# Patient Record
Sex: Female | Born: 1935 | Race: White | Hispanic: No | Marital: Single | Smoking: Never smoker
Health system: Southern US, Community
[De-identification: ages and names within clinical notes are randomized; demographics above are authoritative.]

## PROBLEM LIST (undated history)

## (undated) DIAGNOSIS — E785 Hyperlipidemia, unspecified: Secondary | ICD-10-CM

## (undated) DIAGNOSIS — I2510 Atherosclerotic heart disease of native coronary artery without angina pectoris: Secondary | ICD-10-CM

## (undated) DIAGNOSIS — G629 Polyneuropathy, unspecified: Secondary | ICD-10-CM

## (undated) DIAGNOSIS — I10 Essential (primary) hypertension: Secondary | ICD-10-CM

## (undated) DIAGNOSIS — I509 Heart failure, unspecified: Secondary | ICD-10-CM

## (undated) DIAGNOSIS — I272 Pulmonary hypertension, unspecified: Secondary | ICD-10-CM

## (undated) HISTORY — PX: ABDOMINAL HYSTERECTOMY: SHX81

## (undated) HISTORY — DX: Hyperlipidemia, unspecified: E78.5

## (undated) HISTORY — DX: Polyneuropathy, unspecified: G62.9

## (undated) HISTORY — PX: LUMBAR DISC SURGERY: SHX700

## (undated) HISTORY — DX: Essential (primary) hypertension: I10

## (undated) HISTORY — DX: Pulmonary hypertension, unspecified: I27.20

## (undated) HISTORY — PX: MITRAL VALVE REPAIR: SHX2039

## (undated) HISTORY — PX: CORONARY ARTERY BYPASS GRAFT: SHX141

## (undated) HISTORY — DX: Atherosclerotic heart disease of native coronary artery without angina pectoris: I25.10

## (undated) HISTORY — PX: BLADDER REPAIR: SHX76

## (undated) HISTORY — DX: Heart failure, unspecified: I50.9

## (undated) HISTORY — PX: OTHER SURGICAL HISTORY: SHX169

---

## 1999-12-06 ENCOUNTER — Other Ambulatory Visit: Admission: RE | Admit: 1999-12-06 | Discharge: 1999-12-06 | Payer: Self-pay | Admitting: Gynecology

## 2002-01-22 ENCOUNTER — Other Ambulatory Visit: Admission: RE | Admit: 2002-01-22 | Discharge: 2002-01-22 | Payer: Self-pay | Admitting: Gynecology

## 2003-07-16 ENCOUNTER — Inpatient Hospital Stay (HOSPITAL_COMMUNITY): Admission: RE | Admit: 2003-07-16 | Discharge: 2003-07-17 | Payer: Self-pay | Admitting: Neurosurgery

## 2004-04-20 ENCOUNTER — Inpatient Hospital Stay (HOSPITAL_COMMUNITY): Admission: EM | Admit: 2004-04-20 | Discharge: 2004-05-05 | Payer: Self-pay | Admitting: Emergency Medicine

## 2004-04-20 ENCOUNTER — Ambulatory Visit: Payer: Self-pay | Admitting: Physical Medicine & Rehabilitation

## 2004-04-20 ENCOUNTER — Ambulatory Visit: Payer: Self-pay | Admitting: Internal Medicine

## 2004-04-21 ENCOUNTER — Encounter: Payer: Self-pay | Admitting: Cardiovascular Disease

## 2004-04-26 ENCOUNTER — Encounter: Payer: Self-pay | Admitting: Cardiovascular Disease

## 2004-05-05 ENCOUNTER — Inpatient Hospital Stay
Admission: RE | Admit: 2004-05-05 | Discharge: 2004-05-14 | Payer: Self-pay | Admitting: Physical Medicine & Rehabilitation

## 2004-06-14 ENCOUNTER — Encounter
Admission: RE | Admit: 2004-06-14 | Discharge: 2004-06-14 | Payer: Self-pay | Admitting: Thoracic Surgery (Cardiothoracic Vascular Surgery)

## 2004-12-25 ENCOUNTER — Encounter: Admission: RE | Admit: 2004-12-25 | Discharge: 2004-12-25 | Payer: Self-pay | Admitting: Neurology

## 2006-04-06 IMAGING — CR DG CHEST 1V PORT
1 series · 1 of 1 positions shown · non-contrast
Comparison: 04/22/2004

CLINICAL DATA: CHF, CABG, chest tubes

PORTABLE CHEST - 1 VIEW:

[view not recorded]
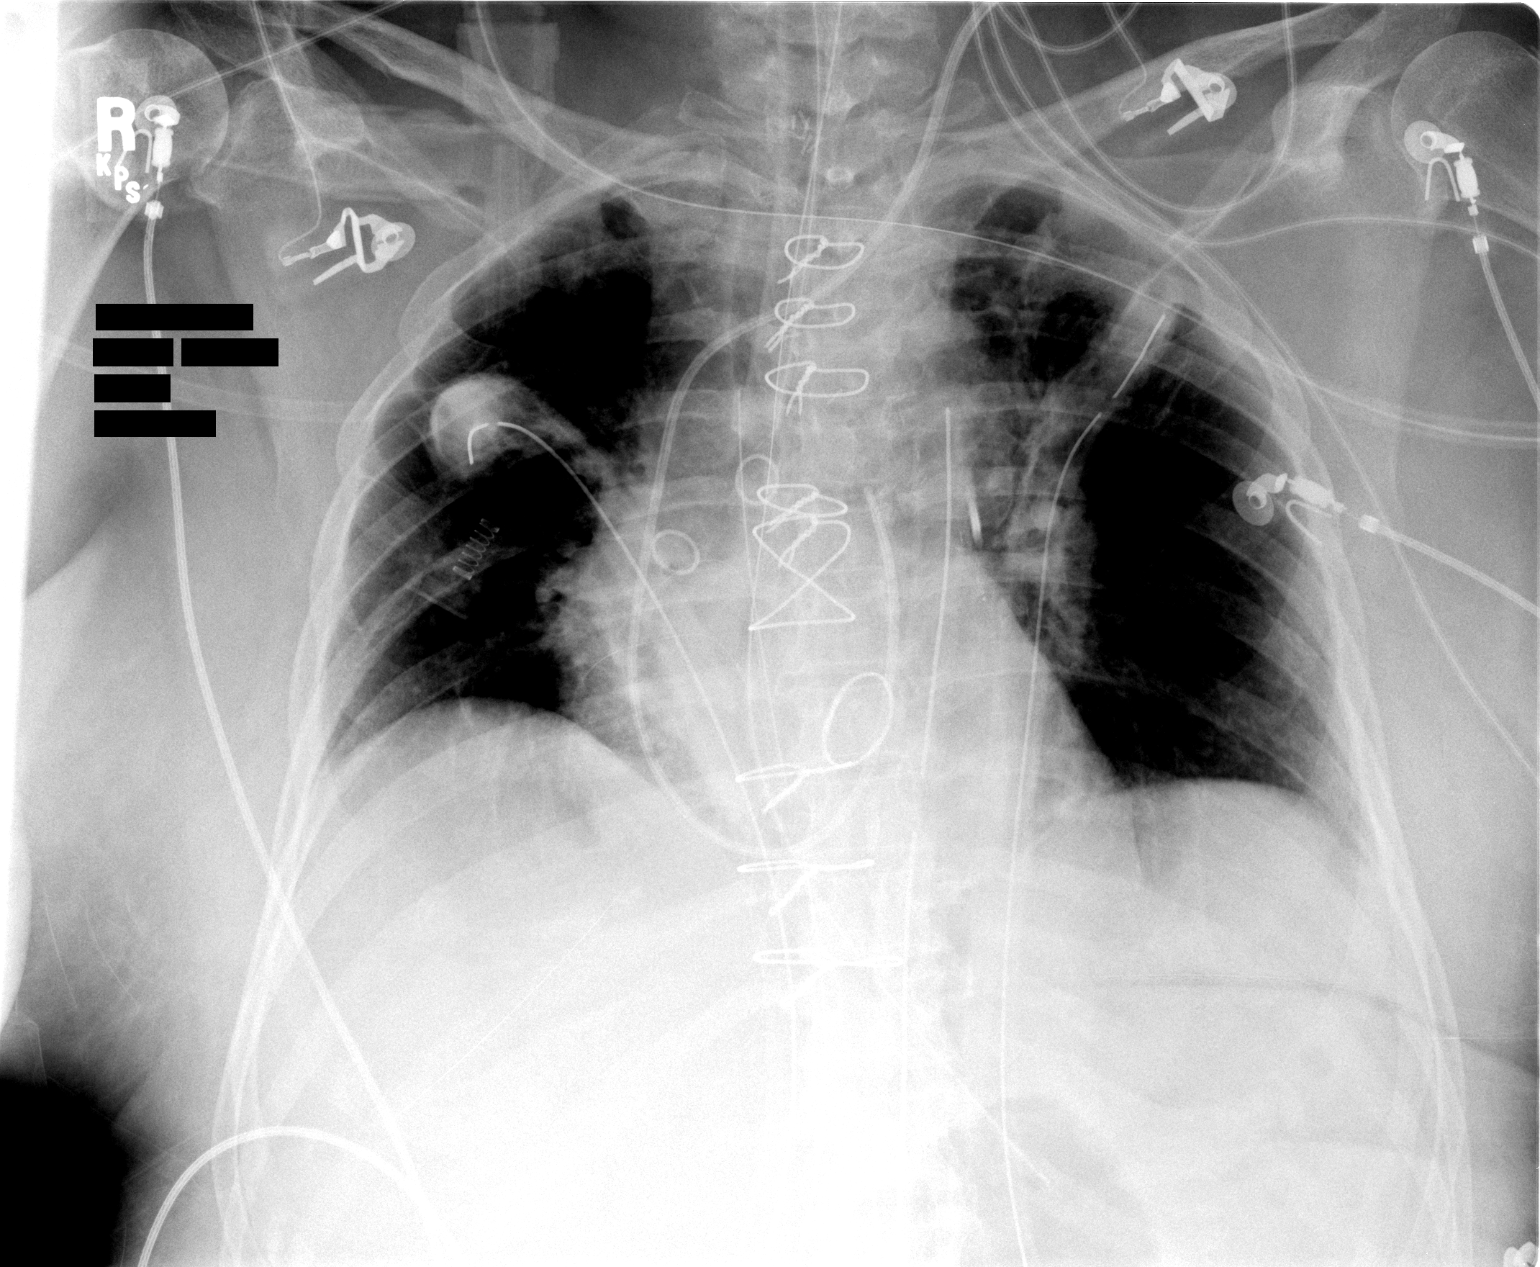

[1 of 1 positions shown; findings below may reference images not displayed]

FINDINGS: Support devices are stable including bilateral chest tubes. No
pneumothorax. IABP position stable in the proximal descending thoracic aorta.
Low lung volumes  with stable bibasilar atelectasis.
IMPRESSION: No change in the appearance of the chest.

## 2006-04-08 IMAGING — CR DG CHEST 1V PORT
1 series · 1 of 1 positions shown · non-contrast
Comparison: 04/24/04.

CLINICAL DATA: Follow-up CABG procedure.  
 PORTABLE CHEST:

[view not recorded]
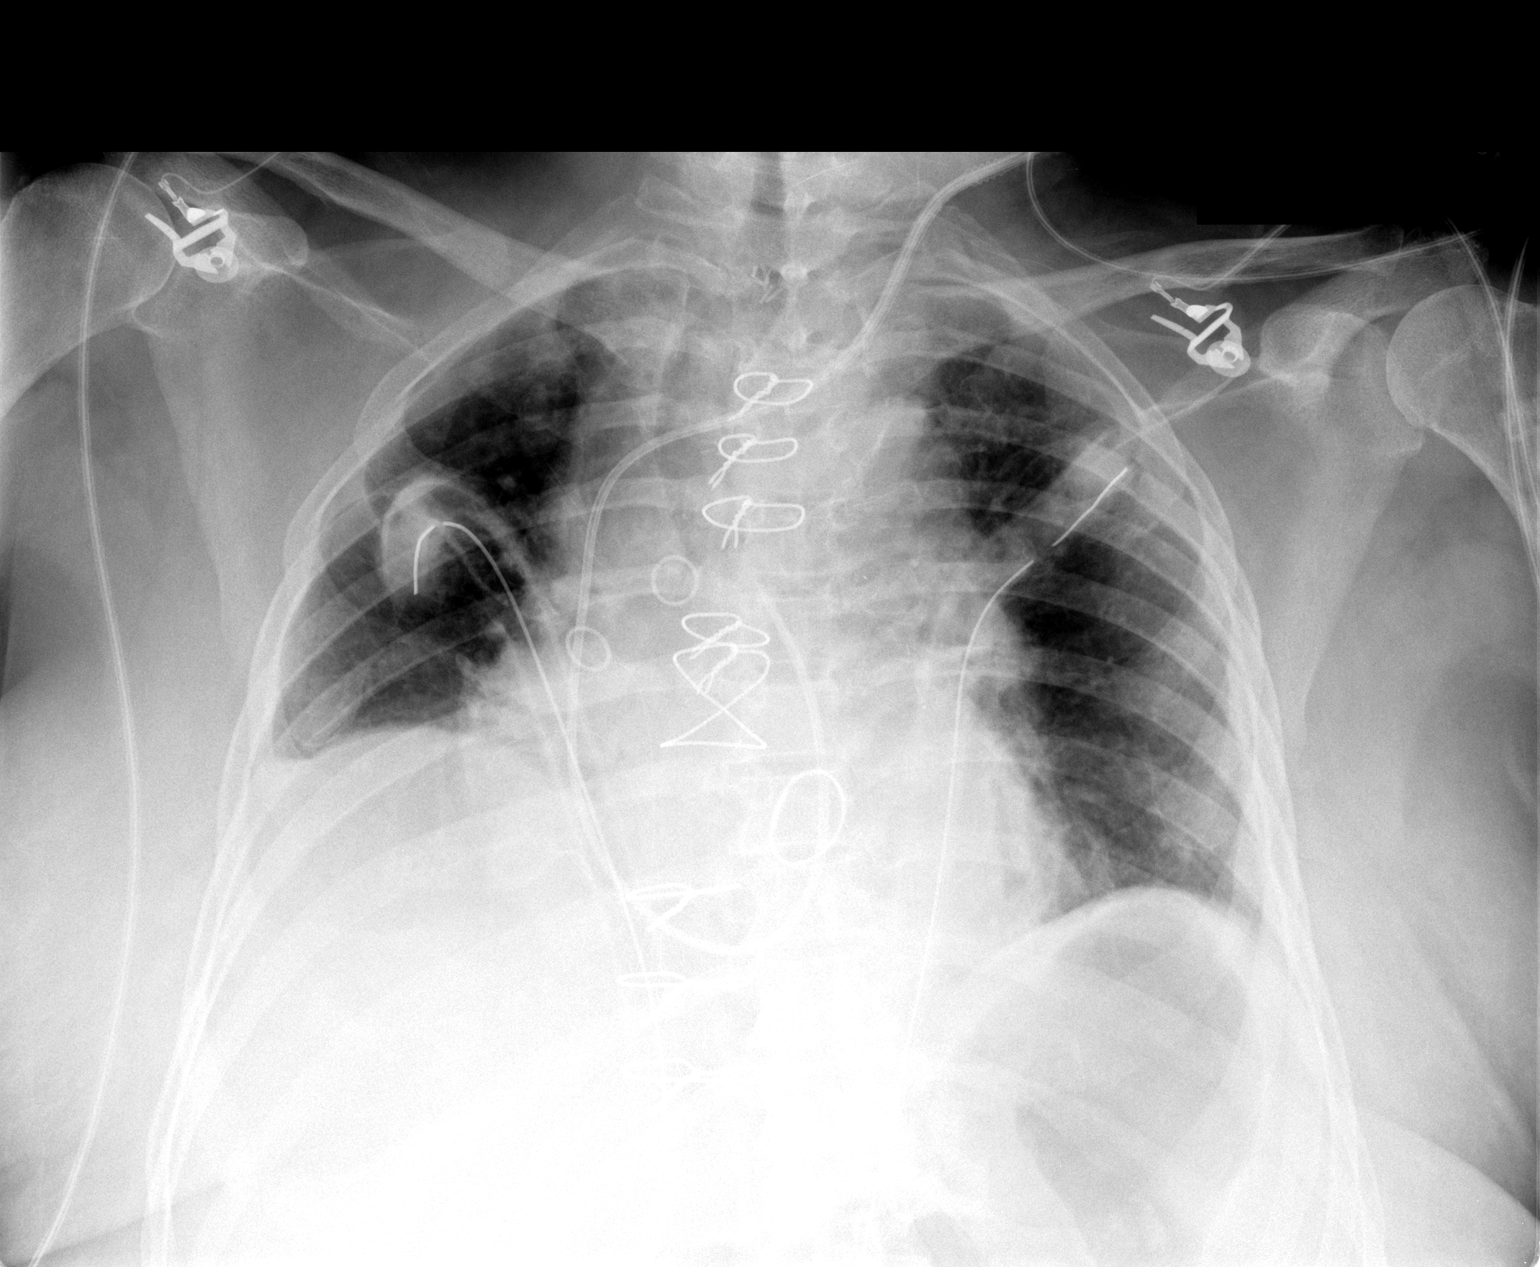

[1 of 1 positions shown; findings below may reference images not displayed]

Mediastinal drains have been removed.  Bilateral chest tubes and Swan Ganz catheter remain in satisfactory position.  Negative for pneumothorax.  Mediastinal fullness without change.  Negative for edema.
IMPRESSION: No significant change status post mediastinal drains removal.

## 2006-04-09 IMAGING — CR DG CHEST 1V PORT
1 series · 1 of 1 positions shown · non-contrast
Comparison: 04/25/04.

CLINICAL DATA: CHF.
 PORTABLE CHEST ([DATE] HOURS):

[view not recorded]
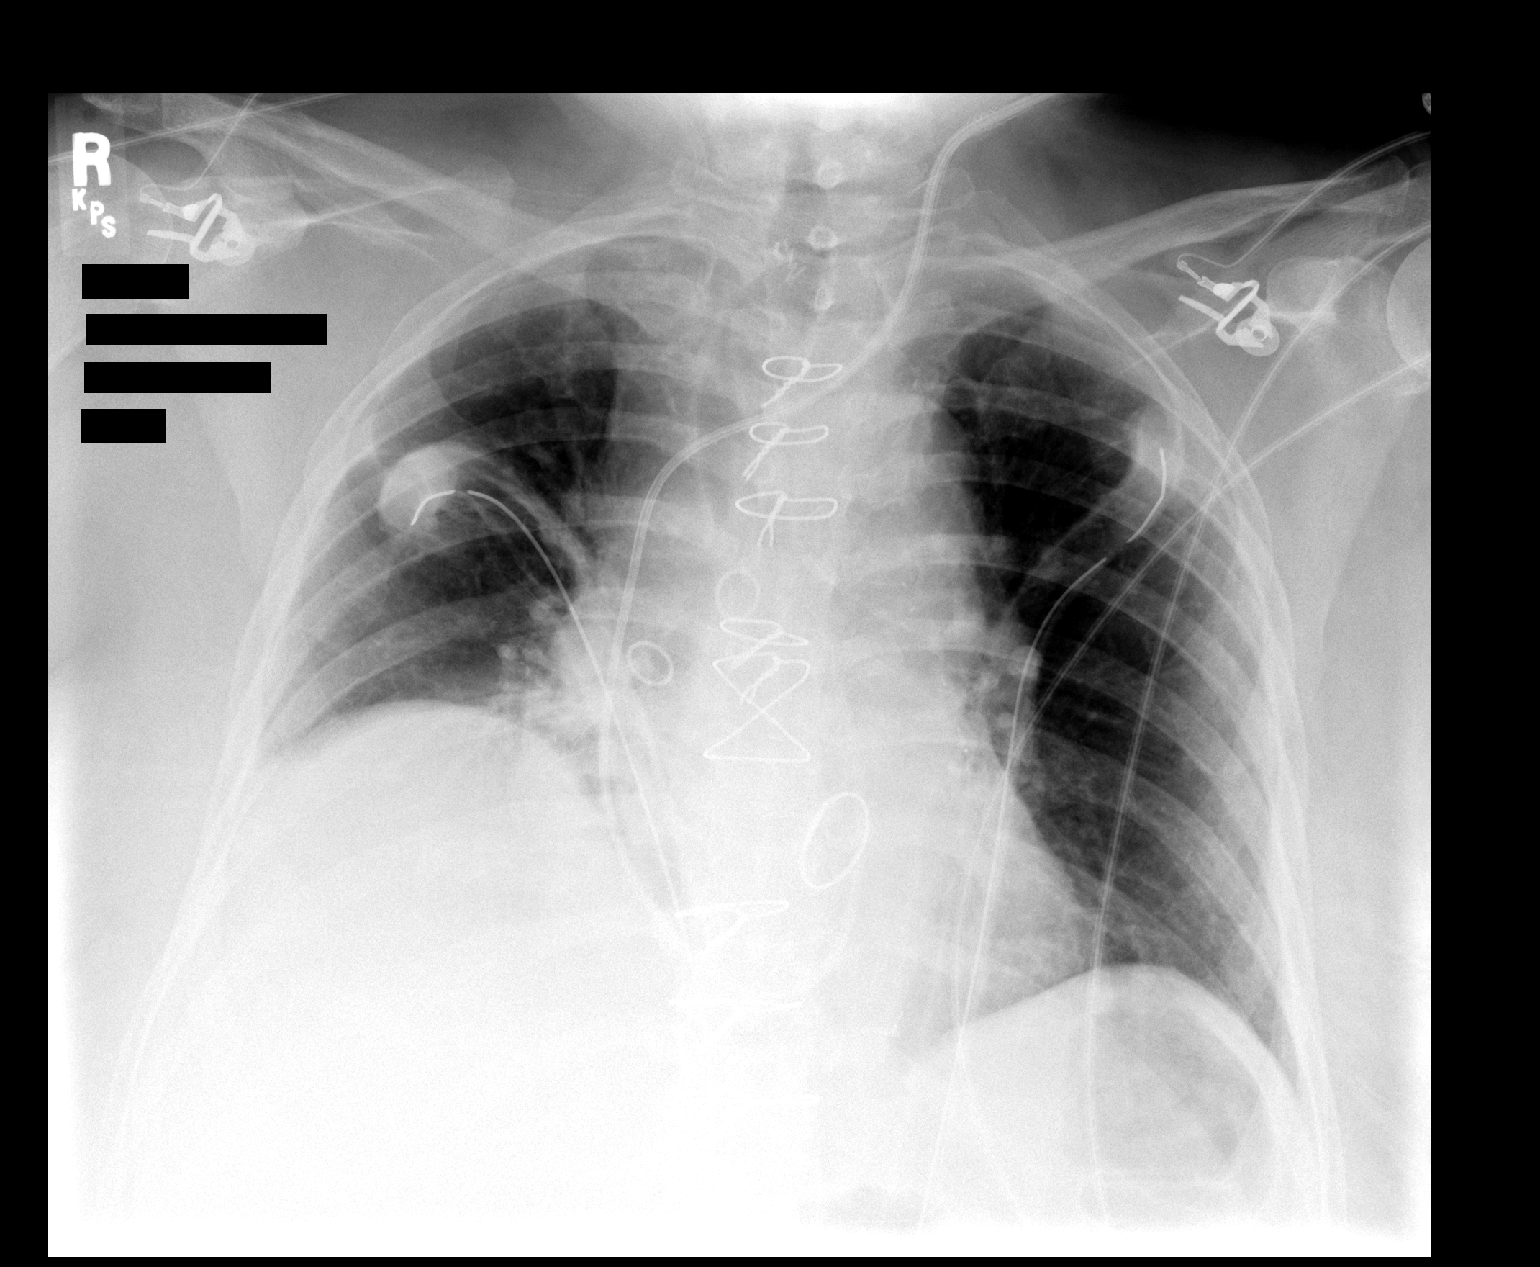

[1 of 1 positions shown; findings below may reference images not displayed]

FINDINGS: The left IJ vein introducer and Swan-Ganz catheter as well as two chest tubes are stable.  No pneumothoraces are seen.  The pulmonary vasculature is within normal limits.  Volume loss at the right lung base is stable.
IMPRESSION: No significant change.

## 2006-04-09 IMAGING — CR DG CHEST 1V PORT
1 series · 1 of 1 positions shown · non-contrast
Comparison: 04/26/04 at [DATE] a.m.

CLINICAL DATA: 81-year-old, with CHF and PICC line placement.  
 PORTABLE 1-VIEW CHEST:

[view not recorded]
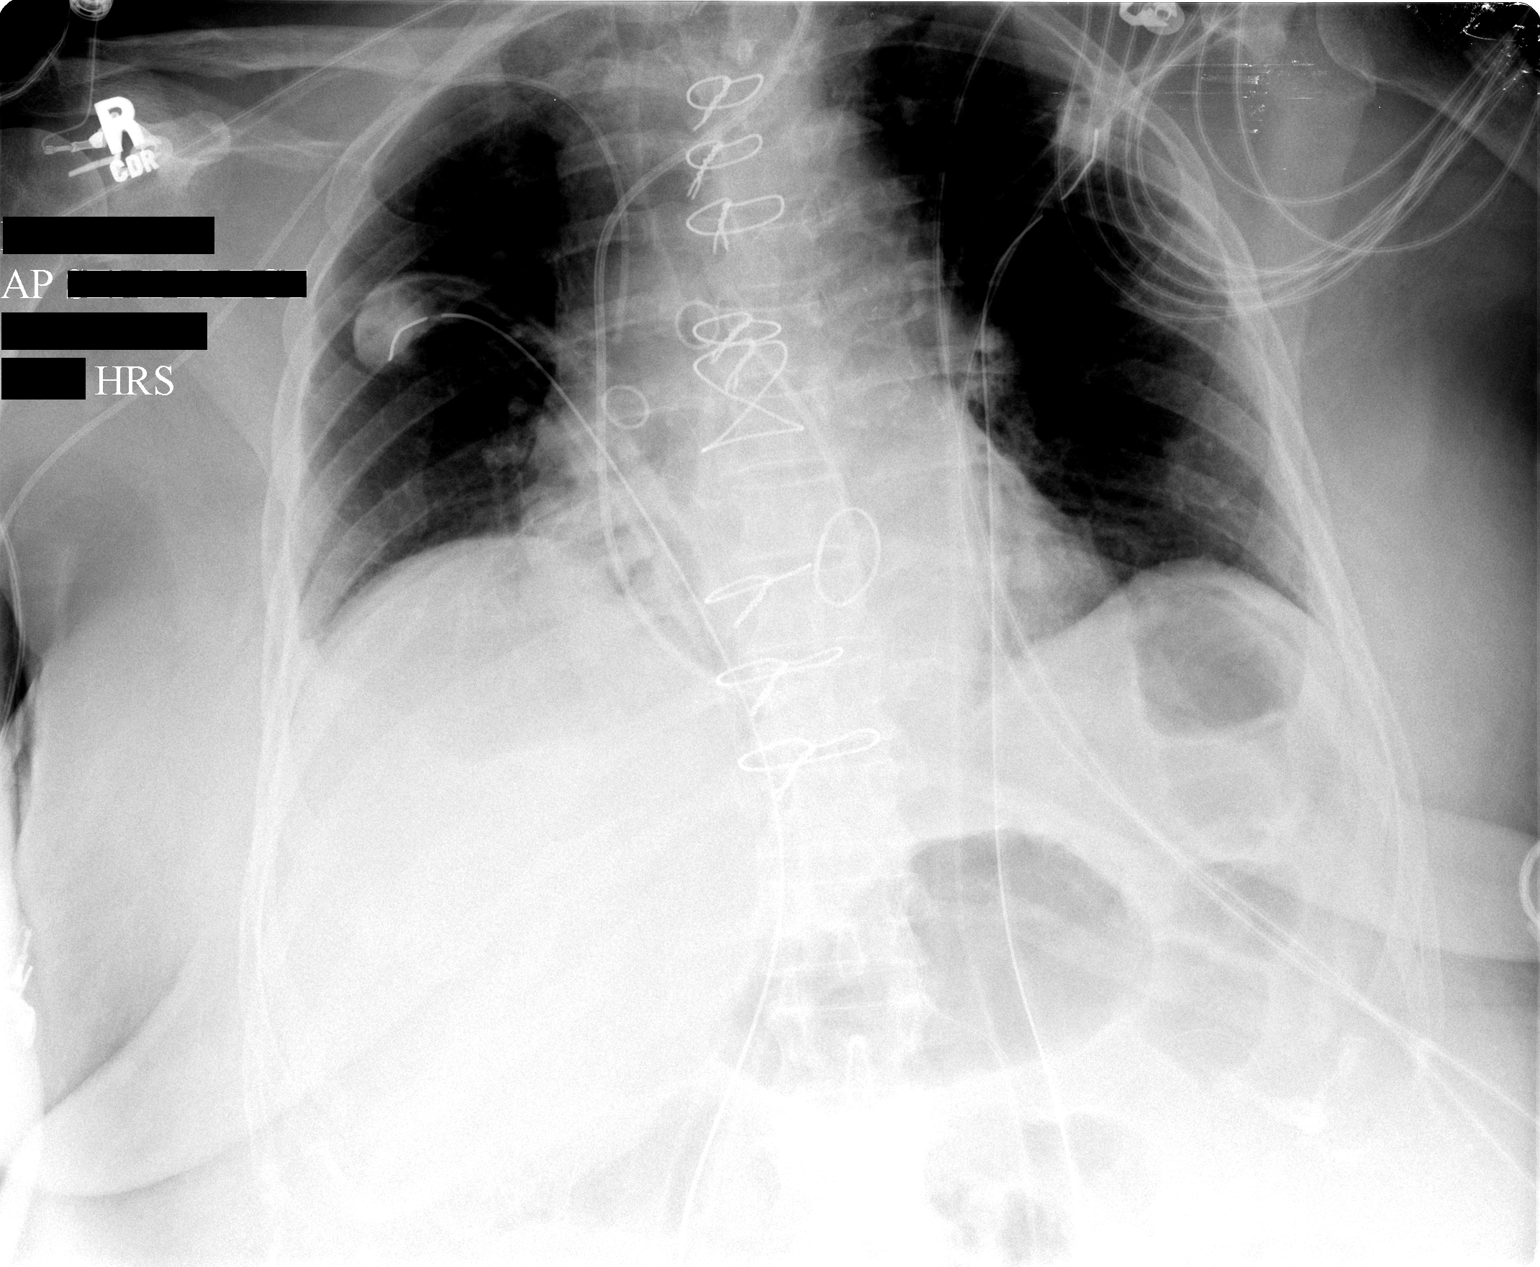

[1 of 1 positions shown; findings below may reference images not displayed]

Portable exam is performed at [DATE] hours, showing new right-sided PICC line with tip to the level of the superior vena cava.  Swan-Ganz catheter, bilateral chest tubes are unchanged in position.  There is no evidence for pneumothorax.  Heart size is enlarged.  There is elevation of the right hemidiaphragm, stable.
IMPRESSION: Right-sided PICC line as described.  No evidence for postprocedure pneumothorax.

## 2006-04-10 IMAGING — CR DG CHEST 1V PORT
1 series · 1 of 1 positions shown · non-contrast
Comparison: 04/26/04.

CLINICAL DATA: CHF.
 PORTABLE CHEST, 04/27/04, [DATE] HOURS:

[view not recorded]
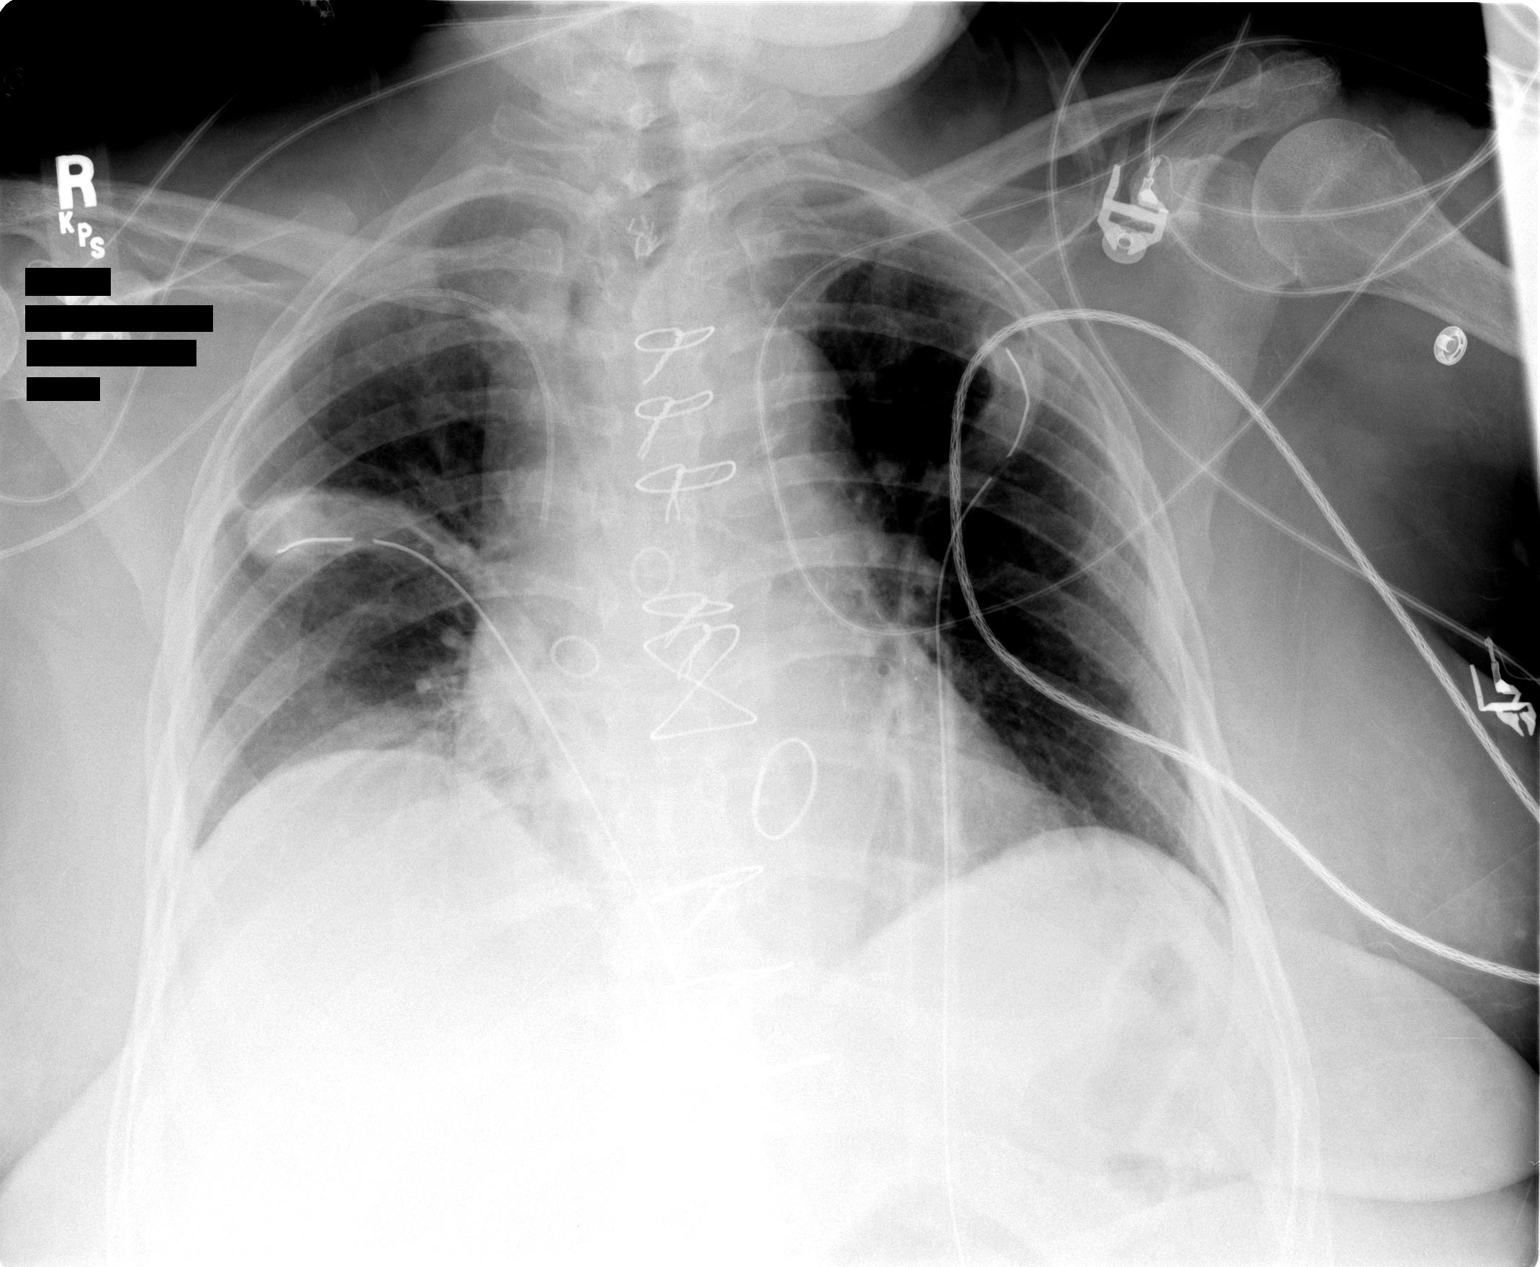

[1 of 1 positions shown; findings below may reference images not displayed]

The Swan Ganz catheter and introducer have been removed.  The PICC and bilateral chest tubes are stable without pneumothorax.  Right basilar atelectasis is stable.
IMPRESSION: Removal of the Swan Ganz catheter.  No pneumothorax.  Otherwise, no significant change.

## 2009-04-13 ENCOUNTER — Ambulatory Visit (HOSPITAL_COMMUNITY): Admission: RE | Admit: 2009-04-13 | Discharge: 2009-04-13 | Payer: Self-pay | Admitting: Family Medicine

## 2009-10-05 ENCOUNTER — Ambulatory Visit: Payer: Self-pay | Admitting: Cardiovascular Disease

## 2010-04-19 ENCOUNTER — Other Ambulatory Visit: Payer: Self-pay | Admitting: *Deleted

## 2010-04-19 ENCOUNTER — Encounter: Payer: Self-pay | Admitting: *Deleted

## 2010-04-19 DIAGNOSIS — Z79899 Other long term (current) drug therapy: Secondary | ICD-10-CM

## 2010-04-20 ENCOUNTER — Encounter: Payer: Self-pay | Admitting: Cardiovascular Disease

## 2010-04-20 ENCOUNTER — Ambulatory Visit (INDEPENDENT_AMBULATORY_CARE_PROVIDER_SITE_OTHER): Payer: Medicare Other | Admitting: Cardiovascular Disease

## 2010-04-20 ENCOUNTER — Other Ambulatory Visit (INDEPENDENT_AMBULATORY_CARE_PROVIDER_SITE_OTHER): Payer: Medicare Other | Admitting: *Deleted

## 2010-04-20 VITALS — BP 142/68 | HR 58 | Ht 66.0 in | Wt 148.0 lb

## 2010-04-20 DIAGNOSIS — I2510 Atherosclerotic heart disease of native coronary artery without angina pectoris: Secondary | ICD-10-CM

## 2010-04-20 DIAGNOSIS — M549 Dorsalgia, unspecified: Secondary | ICD-10-CM

## 2010-04-20 DIAGNOSIS — Z79899 Other long term (current) drug therapy: Secondary | ICD-10-CM

## 2010-04-20 DIAGNOSIS — I2789 Other specified pulmonary heart diseases: Secondary | ICD-10-CM

## 2010-04-20 DIAGNOSIS — I272 Pulmonary hypertension, unspecified: Secondary | ICD-10-CM | POA: Insufficient documentation

## 2010-04-20 LAB — BASIC METABOLIC PANEL
CO2: 28 mEq/L (ref 19–32)
Calcium: 9.6 mg/dL (ref 8.4–10.5)
Creatinine, Ser: 0.8 mg/dL (ref 0.4–1.2)
GFR: 70.27 mL/min (ref >60.00)
Potassium: 4 mEq/L (ref 3.5–5.1)

## 2010-04-20 NOTE — Progress Notes (Signed)
April Bennett Date of Birth  1922/11/29 [facility] / [facility] [address]     [address] [place], [place]  [address] [phone]  Fax  [phone]  History of Present Illness:  [name] is an elderly female with a history of coronary artery disease and coronary artery bypass grafting. She also has a history of mitral valve repair. She has a history of pulmonary hypertension.  She has significant peripheral neuropathy and has difficulty walking. She walks with the assistance of a rolling walker.  Current Outpatient Prescriptions on File Prior to Visit  Medication Sig Dispense Refill  . allopurinol (ZYLOPRIM) 100 MG tablet Take 100 mg by mouth daily.        Marland Kitchen aspirin 81 MG tablet Take 81 mg by mouth daily.        . furosemide (LASIX) 40 MG tablet Take 40 mg by mouth daily.        . insulin NPH (HUMULIN N,NOVOLIN N) 100 UNIT/ML injection Inject 20 Units into the skin daily.        Marland Kitchen losartan (COZAAR) 100 MG tablet Take 100 mg by mouth daily.        Marland Kitchen lovastatin (ALTOPREV) 40 MG 24 hr tablet Take 40 mg by mouth at bedtime.        . metoprolol (LOPRESSOR) 50 MG tablet Take 25 mg by mouth daily.        . Multiple Vitamin (MULTIVITAMIN) tablet Take 1 tablet by mouth daily.        Marland Kitchen omeprazole (PRILOSEC) 20 MG capsule Take 20 mg by mouth 2 (two) times daily.        . potassium chloride (K-DUR,KLOR-CON) 10 MEQ tablet Take 10 mEq by mouth 2 (two) times daily.        Marland Kitchen spironolactone (ALDACTONE) 25 MG tablet Take 25 mg by mouth daily.          Allergies  Allergen Reactions  . Amiodarone     Past Medical History  Diagnosis Date  . Pulmonary HTN   . Diabetes mellitus   . Hyperlipidemia   . Peripheral neuropathy   . Hypertension   . CHF (congestive heart failure)   . Coronary artery disease     MI; S/P MI April 2006    Past Surgical History  Procedure Date  . Coronary artery bypass graft   . Mitral valve repair   . Abdominal hysterectomy    . Bladder repair   . Lumbar disc surgery     2005  . Gastritis   . Esophagitis   . Gout     History  Smoking status  . Never Smoker   Smokeless tobacco  . Not on file    History  Alcohol Use No    No family history on file.  Reviw of Systems:  Reviewed in the HPI.  All other systems are negative.  Physical Exam: BP 142/68  Pulse 58  Ht 5\' 6"  (1.676 m)  Wt 148 lb (67.132 kg)  BMI 23.89 kg/m2 The patient is alert and oriented x 3.  The mood and affect are normal.  The skin is warm and dry.  Color is normal.  The HEENT exam reveals that the sclera are nonicteric.  The mucous membranes are moist.  The carotids are 2+ without bruits.  There is no thyromegaly.  There is no JVD.  The lungs have decreased breath sounds in the left base.  The chest wall is non tender.  The heart exam reveals a regular rate with a normal S1 and S2.  There are no murmurs, gallops, or rubs.  The PMI is not displaced.   Abdominal exam reveals good bowel sounds.  There is no guarding or rebound.  There is no hepatosplenomegaly or tenderness.  There are no masses.  Exam of the legs reveal no clubbing, cyanosis, or edema.  The legs are without rashes.  The distal pulses are intact.  Cranial nerves II - XII are intact.  Motor and sensory functions are intact.  The gait is normal.  Assessment / Plan:

## 2010-04-20 NOTE — Assessment & Plan Note (Signed)
April Bennett is very stable from a cardiac standpoint. Her blood pressure and heart rate are normal. I've encouraged her to exercise as much is possible that she has considerable problems walking because of her peripheral neuropathy.

## 2010-04-21 ENCOUNTER — Telehealth: Payer: Self-pay | Admitting: *Deleted

## 2010-04-21 NOTE — Telephone Encounter (Signed)
Patient called with lab results. Pt very HOH,April Bennett/ranger

## 2010-05-21 NOTE — Cardiovascular Report (Signed)
NAMELAELA, DEVINEY NO.:  [id number]   MEDICAL RECORD NO.:  [id number]          PATIENT TYPE:  INP   LOCATION:  2107                         FACILITY:  [facility]   PHYSICIAN:  Vesta Mixer, M.D. DATE OF BIRTH:  1922-12-11   DATE OF PROCEDURE:  04/21/2004  DATE OF DISCHARGE:                              CARDIAC CATHETERIZATION   Ms. Yeske is a 77 year old female with a 79-month history of spells.  She had a Cardiolite scan by Dr. Deborah Chalk which revealed no evidence of  ischemia and revealed normal left ventricular systolic function. She  continued to have some intermittent spells. She was admitted to the hospital  yesterday with worsening dyspnea. This morning she had flash pulmonary edema  and we are asked to consult. At the time that we evaluated her, she was  improving somewhat from her flash pulmonary edema. She still was somewhat  hyperdynamic and had some wheezes. She was transferred to the intensive care  unit and continued to get a little bit better on IV Lasix.   Later this afternoon she developed severe flash pulmonary edema with  hypotension. She developed ST-segment depression in the lateral leads with  loss of R wave in the anteroseptal leads. We brought her down to the cath  lab for urgent heart catheterization.   PROCEDURE:  Left heart catheterization with placement of an intra-aortic  balloon pump and coronary angiography.   DESCRIPTION OF PROCEDURE:  The right femoral artery cannula and the left  femoral artery were cannulated using the assistance of a Smart needle.   Initially the left angiograms were performed and then an aortic balloon pump  was placed. We used a 34-cm balloon pump.   HEMODYNAMIC RESULTS:  Her initial pressure was 50/40. With augmentation, her  systolic pressures were in the 103 range.   ANGIOGRAPHY:  1.  Left main. There was an ostial 70-80% stenosis. Following this there was      an ulcerated plaque with a 95%  stenosis in the distal left main. There      was sluggish flow down the LAD and circumflex artery.  2.  The diagonal vessels had diffuse 50% irregularities.  3.  The left circumflex artery was a moderate size vessel. There was the      first obtuse marginal artery had minor luminal irregularities. The      second obtuse marginal artery had mild luminal irregularities. The      distal circumflex artery was unremarkable.  4.  The right coronary artery had an ostial 60-70% stenosis. There was      dampening of the pressure wave form with engagement of the right      coronary artery using the 6-French catheter.   LEFT VENTRICULOGRAM:  The left ventriculogram was performed by hand  injection. It revealed an ejection fraction of 45-50%. There was severe 3+  mitral regurgitation with moderate-to-marked left atrial enlargement.   COMPLICATIONS:  None.   IMPRESSION AND PLAN:  1.  Coronary artery disease. The patient has severe left main coronary      artery disease with right coronary artery involvement. She  also has      severe mitral regurgitation.  2.  Cardiogenic shock.  3.  We have CVTS for emergent surgery including coronary artery bypass      grafting with mitral valve replacement or mitral valve repair. I have      discussed these findings with the family. They know that her prognosis,      at this point, is still fairly guarded.      PJN/MEDQ  D:  04/21/2004  T:  04/21/2004  Job:  [id number]   cc:   Madaline Guthrie, M.D.  [address]  [place] [address]  Fax: [phone]   Salvatore Decent. Cornelius Moras, M.D.  [address]  [place]  [place] [address]

## 2010-05-21 NOTE — Op Note (Signed)
[name], April Bennett            ACCOUNT NO.:  [id number]   MEDICAL RECORD NO.:  [id number]          PATIENT TYPE:  INP   LOCATION:  2307                         FACILITY:  [facility]   PHYSICIAN:  Guadalupe Maple, M.D.  DATE OF BIRTH:  25-Sep-1922   DATE OF PROCEDURE:  04/21/2004  DATE OF DISCHARGE:                                 OPERATIVE REPORT   PROCEDURE:  Intraoperative transesophageal echocardiography.   [name] is a 77 year old white female who was undergoing  coronary artery bypass grafting emergently by Dr. Tressie Stalker.  She had  earlier had a transesophageal echocardiography and this is a followup study  following her separation from cardiopulmonary bypass.  Ms. Coudriet  appears to have suffered a severe anterior and lateral wall myocardial  infarction resulting from an occluded left main coronary artery.  She  underwent emergency coronary artery bypass grafting and mitral valve  annuloplasty by Dr. Tressie Stalker.   IMPRESSION POST BYPASS FINDINGS:  1.  Left ventricle.  There was improvement in the left ventricular function      from the pre bypass study.  The basilar areas of the anterior and      lateral walls to the mid papillary level were moving and showed      hypokinesis but were exhibiting contractility which was not present on      the pre bypass study.  However, the distal anterior wall and lateral      wall and apex were akinetic.  The left ventricle ejection fraction was      estimated at 20-25%.  The left ventricle cavity was smaller than the pre      bypass study.  The inferior wall and the inferior septum were      contracting more vigorously than during the pre bypass study as well.  2.  Mitral valve.  There was an annuloplasty ring noted in the mitral valve      position.  The leaflets __________  well.  There was trace mitral      insufficiency noted.  3.  Aortic valve.  The aortic valve appeared to open normally and there was      no  aortic insufficiency and no significant calcification of the aortic      leaflets.  4.  Right ventricle.  The right ventricular cavity was moderately dilated      but there was good contractility of the right ventricular free wall.  5.  Tricuspid valve.  The tricuspid anulus was dilated and there was 2+      tricuspid insufficiency but the valvular      apparatus appeared structurally normal.  6.  Interatrial septum was intact without evidence of patent foramen ovale      or atrial septal defect.  7.  The left atrium was enlarged but there was no evidence of thrombus in      the left atrium or left atrial appendage.      DCJ/MEDQ  D:  04/22/2004  T:  04/22/2004  Job:  [id number]   cc:   Guadalupe Maple, M.D.  [address]  [address] [place]  [place]  [place] [address]  Fax: [phone]   patient's chart

## 2010-05-21 NOTE — H&P (Signed)
NAMEMURDIS, FLITTON            ACCOUNT NO.:  [id number]   MEDICAL RECORD NO.:  [id number]          PATIENT TYPE:  ORB   LOCATION:  4522                         FACILITY:  [facility]   PHYSICIAN:  Ranelle Oyster, M.D.DATE OF BIRTH:  10-14-1922   DATE OF ADMISSION:  05/05/2004  DATE OF DISCHARGE:                                HISTORY & PHYSICAL   CHIEF COMPLAINT:  Deconditioning.   HISTORY OF PRESENT ILLNESS:  This is a 77 year old white female with  coronary artery disease and diabetes who was admitted on April 18 with  shortness of breath.  She developed flash pulmonary edema which required a  cardiac catheterization which she was noted to have three vessel disease and  mitral regurgitation.  The patient had an uncomplicated course and  ultimately required an emergent CABG x 3 with mitral valve replacement on  April 19.  The patient had a complicated course and ultimately required  emergent CABG x 3 with mitral valve replacement on April 19.  Patient with  postoperative confusion and deconditioning.  She continues to have frequent  shortness of breath and lacks endurance.  The confusion has been generally  improving.  Currently, she is at a min assist level for basic mobility and  self care.  She is ambulating 300 feet with guard to min assist.   REVIEW OF SYMPTOMS:  Positive for hearing loss, shortness of breath, chest  pain, edema, reflux, weakness, insomnia, and anxiety, as well as dysphagia.   PAST MEDICAL HISTORY:  Significant for a hysterectomy with bladder repair,  lumbar surgery, diabetes, hypertension, gastritis and esophagitis, gout.   FAMILY HISTORY:  Noncontributory.   SOCIAL HISTORY:  The patient is married but very sedentary.  She was  independent prior to arrival.  She does not smoke or drink.   MEDICATIONS ON ADMISSION:  Aspirin 81 mg daily, potassium 10 mEq 2 b.i.d.,  Glucotrol XL 5 mg daily, Actos 30 mg daily, Premarin 0.3 mg 1st through 25th  day of the  month, Colchicine 0.6 mg daily, Lipitor 10 mg daily, Prilosec 40  mg daily, Nitropatch 0.4 mg daily, Xanax p.r.n.   LABORATORY DATA:  Hemoglobin 12.6, BUN 25, creatinine 1.1, sodium 139,  potassium 3.9.  I do not have a recent chest x-ray for review.   PHYSICAL EXAMINATION:  VITAL SIGNS:  Afebrile, vital signs stable.  GENERAL:  She is an obese lady in fair to mild distress due to dyspnea.  HEENT:  Pupils equal, round, reactive to light and accommodation,  extraocular movements intact.  Oral mucosa pink and moist.  The patient has  multiple cavities.  NECK:  Supple without adenopathy.  I did appreciate JVD.  CHEST:  To auscultation, the patient had bilateral rales and decreased  breath sounds, a bit more on the left side.  HEART:  Regular rate and rhythm, the patient had 2+ edema of both lower  extremities and 1+ in the upper extremities.  ABDOMEN:  Soft, nontender, obese.  MUSCULOSKELETAL:  Intact strength at 4+/5 out of 5.  Sensory function is as  bit diminished due to edema distally.  Cognitively, the patient  is  appropriate.  She is a bit anxious.  Cranial nerve exam is intact.  Reflexes  are 1+ to 2+.  SKIN:  The surgical wounds are intact.  The heels are a bit reddened.  Minor  abrasions were noted.   ASSESSMENT/PLAN:  1.  Functional deficits secondary to deconditioning status post CABG and      multiple cardiac complications.  Patient with CHF and effusions which      limit cardiopulmonary endurance.  Begin subacute level rehab with      modified independent to supervision goals.  Estimated length of stay      less than seven days pending medical stability.  Prognosis fair to good.  2.  Pain management with Ultram and Tylenol p.r.n.  3.  DVT prophylaxis/mitral valve prophylaxis with Coumadin.  4.  Diabetes.  Continue Glucotrol and monitor CBGs.  5.  Gout.  Resume Colchicine.  6.  Mood.  Add Xanax p.r.n.  7.  Pulmonary/CHF.  Will give an additional 20 mg Lasix today as the  patient      is working pretty hard to breathe and the patient has decreased breath      sounds midway and downward in both lungs.  Will follow up chest x-ray in      the morning.  Continue Lasix 40 mg b.i.d.  Avoid excess salt.  Monitor      clinically.  8.  Mental status seems to be improving.  Observe.  9.  Renal insufficiency.  Hold ACE inhibitor for now.  Will check      electrolytes tomorrow.      ZTS/MEDQ  D:  05/05/2004  T:  05/05/2004  Job:  [id number]

## 2010-05-21 NOTE — Consult Note (Signed)
April Bennett, April Bennett            ACCOUNT NO.:  [id number]   MEDICAL RECORD NO.:  [id number]          PATIENT TYPE:  INP   LOCATION:  2107                         FACILITY:  [facility]   PHYSICIAN:  Salvatore Decent. Cornelius Moras, M.D. DATE OF BIRTH:  11/11/1922   DATE OF CONSULTATION:  04/21/2004  DATE OF DISCHARGE:                                   CONSULTATION   CONSULTING PHYSICIAN:  Salvatore Decent. Cornelius Moras, M.D.   REQUESTING PHYSICIAN:  Vesta Mixer, M.D.   REASON FOR CONSULTATION:  Critical left main coronary disease, three-vessel  coronary artery disease, cardiogenic shock, acute evolving myocardial  infarction, class IV congestive heart failure, and mitral regurgitation.   HISTORY OF PRESENT ILLNESS:  April Bennett is a 77 year old, married, white  female with past medical history notable for history of hypertension, type 2  diabetes mellitus, and hyperlipidemia.  She apparently has had no previous  history of coronary artery disease or other significant cardiac related  problems.  According to her family, she had enjoyed reasonably good  functional status and quality of life up until the last month.  Over the  last month, she has suffered from recurrent episodes of substernal chest  pain radiating to her arms and her epigastric region as well as severe  shortness of breath.  These episodes have been paroxysmal in nature and at  times somewhat atypical.  She apparently underwent a stress Cardiolite exam  and was evaluated by Dr. Delfin Edis. The specific details of this  evaluation are not currently available, but reportedly, the Cardiolite exam  was felt to be low-risk for possible ischemic heart disease.  The patient  also recently underwent a esophagogastroduodenoscopy and was found to have  evidence of gastritis and esophagitis.  She was admitted to the hospital  yesterday with severe weakness, shortness of breath, and continued  intermittent symptoms of atypical chest pain and class IV  congestive heart  failure.  She was also found to have a murmur on physical exam suggestive of  possible underlying valvular heart disease.  There was no evidence for  pneumonia or other reasons to explain the patient's shortness of breath  other than congestive heart failure.  Baseline electrocardiograms were  without evidence for acute ST elevation, but serial cardiac enzymes were  notable for elevated troponin and mild to moderately elevated B natriuretic  peptide levels.  In the early afternoon of April19, 2006, the patient  developed flash pulmonary edema consistent with congestive heart failure.  Emergency cardiology consultation was requested and the patient was  evaluated by Dr. Elease Hashimoto and subsequently brought to the cardiac cath  lab.  Findings at the time of catheterization demonstrate critical subtotal 99%  distal occlusion of the left main coronary artery, as well as three-vessel  coronary artery disease.  The patient developed cardiogenic shock requiring  intra-aortic balloon pump placement.  Left ventricular function is reduced  with notable significant severe mitral regurgitation.  The patient has been  started on intravenous dopamine and levophed for blood pressure support and  emergency cardiac surgical consultation requested.   REVIEW OF SYSTEMS:  Has been detailed previously  at the time of the  patient's admission to the hospital yesterday. At present, the patient is  not able to provide any review of systems as she is quite somnolent,  although she does respond to her name and follows some simple commands.  The  remainder of her review of systems is documented in the chart and has been  reviewed.   PAST MEDICAL HISTORY:  1. Hypertension.  2. Type 2 diabetes mellitus.  3. Hyperlipidemia.  4. Gastritis and esophagitis     PAST SURGICAL HISTORY:  1. L4-L5 lumbar laminotomy and foraminotomy with resection of synovial      cyst, [id number], by Dr. Newell Coral.  2.  Hysterectomy more than 30 years ago.  3. Bladder resuspension many years previously.     SOCIAL HISTORY:  The patient is married and lives with her husband.  The  patient previously worked in a [employer].  She has a large family that is  very supportive.  She is a nonsmoker and denies significant alcohol  consumption.   FAMILY HISTORY:  Notable for the absence of premature coronary artery  disease.   MEDICATIONS PRIOR TO ADMISSION:  Aspirin, potassium, Glucotrol XL,  hydrochlorothiazide, Actos, Premarin, Lipitor, multivitamin, colchicine,  Accupril, Xanax, Nexium, and nitroglycerin sublingual tablets, all with  doses documented previously.   DRUG ALLERGIES:  The patient has reported that receiving PENICILLIN causes a  rash.   PHYSICAL EXAM:  GENERAL:  The patient is, at present, in the cardiac cath  lab on the table.  She appears to be in supraventricular tachycardia or  sinus tachycardia with blood pressure measured in the 70 to 80s by the intra-  aortic balloon pump in cardiogenic shock.  PULMONARY:  She is breathing adequately on with 100% oxygen by face mask and  maintaining oxygen saturations. Breath sounds are somewhat shallow but  overall symmetrical without significant rhonchi anteriorly.  CARDIAC:  Is not feasible, as the patient remains under sterile drapes from  the cardiac cath procedure.  EXTREMITIES:  At the level of the feet are cool.   BLOOD WORK:  Obtained this hospital admission prior to catheterization  include:  Baseline hemoglobin of 13.2 with hematocrit 38%, white blood count  6,900, platelet count 164 thousand.  The patient's baseline electrolytes are  normal with sodium 139, potassium 3.9, chloride 106, bicarb 23, BUN 25,  creatinine 1.1 and baseline serum albumin is low at 3.4. Creatinine has  increased to 1.3 this morning and 1.5 just prior to catheterization.  Baseline hemoglobin A1c is 6.5.  Liver enzymes were within normal limits prior to  catheterization.   DIAGNOSTIC TEST:  Cardiac catheterization performed by Dr. Elease Hashimoto is  reviewed.  This demonstrates 99% subtotal occlusion of the distal left main  coronary artery.  There is also a 50% ostial stenosis of the left main  coronary artery.  There is diffuse disease involving all of the main  coronary arteries consistent with longstanding diabetes.  The left anterior  descending coronary artery is a fairly large vessel and gives rise to a  large diagonal branch which also has some proximal disease.  The left  circumflex coronary artery gives rise to several circumflex marginal  branches, the second circumflex marginal branch appears to be the largest.  There do not appear to be high-grade lesions in this vascular tree.  There  appears to be ostial stenosis of the right coronary artery of at least 50%  stenosis.  There appears to be ostial stenosis of posterior  descending  coronary artery at the bifurcation of the distal right coronary artery with  right dominant coronary circulation.  The left ventricular function is  moderate to severely reduced and there appears to be severe mitral  regurgitation.   IMPRESSION:  Acute evolving myocardial infarction with subtotal occlusion of  the distal left main coronary artery, 3-vessel CAD, LV dysfunction with  severe mitral regurgitation and subsequent cardiogenic shock with class IV  congestive heart failure.   This elderly patient with diabetes has an extremely poor prognosis and  certainly has no chance of survival without surgical intervention.  The  risks of surgery are quite high and the likelihood that she survives this  are probably relatively low in all reasonable estimation, well below 50%  chance of survival.  Furthermore, even with successful surgery,  the patient  is almost certainly in for a prolonged postoperative recovery with likely  complications to include respiratory failure with prolonged ventilatory  support,  possible acute renal failure with or without need for subsequent  renal replacement therapy, and many other possible complications which if  she subsequently recovers could yield a relatively poor long-term functional  status.  Other possible complications including bleeding, arrhythmia, heart  block or bradycardia requiring permanent pacemaker, pneumonia, wound healing  problems or other infections, gastrointestinal complications related or  unrelated to the attempted surgical intervention, or possible  ischemia to either lower extremity with possible loss of limb.  The  patient's family is aware of the dire circumstances.  They specifically wish  that we proceed with emergent surgical intervention for attempted salvage.  All their questions have been addressed.      CHO/MEDQ  D:  04/21/2004  T:  04/22/2004  Job:  [id number]   cc:   Vesta Mixer, M.D.  [address]  [place]  [place] [address]  Fax: [phone]  Colleen Can. Deborah Chalk, M.D.  Fax: [phone]   Madaline Guthrie, M.D.  [address]  [place] [address]  Fax: [phone]

## 2010-05-21 NOTE — Op Note (Signed)
April Bennett, April Bennett                      ACCOUNT NO.:  [id number]   MEDICAL RECORD NO.:  [id number]                   PATIENT TYPE:  INP   LOCATION:  2899                                 FACILITY:  [facility]   PHYSICIAN:  Hewitt Shorts, M.D.            DATE OF BIRTH:  02/22/1922   DATE OF PROCEDURE:  07/16/2003  DATE OF DISCHARGE:                                 OPERATIVE REPORT   PREOPERATIVE DIAGNOSES:  Left L4-5 synovial cyst, lateral recess syndrome,  spondylosis, degenerative disk disease.   POSTOPERATIVE DIAGNOSES:  Left L4-5 synovial cyst, lateral recess syndrome,  spondylosis, degenerative disk disease.   PROCEDURE:  Left L4-5 lumbar laminotomy and foraminotomy and resection of  synovial cyst with microdissection.   SURGEON:  Hewitt Shorts, M.D.   ASSISTANT:  Clydene Fake, M.D.   ANESTHESIA:  General endotracheal.   INDICATIONS:  This is a 77 year old woman who presented with left lumbar  radiculopathy, who was found by MRI scan to have left L4-5 lateral recess  syndrome due to synovial cyst as well as other spondylitic overgrowth.  A  decision was made to proceed with elective laminotomy, foraminotomy, and  resection of synovial cyst.   PROCEDURE:  The patient was brought to the operating room and placed under  general endotracheal anesthesia.  The patient was turned to a prone position  and the lumbar region was prepped with Betadine soap and solution and draped  in a sterile fashion.  The midline was infiltrated with local anesthetic  with epinephrine.  An x-ray was taken and the L4-5 level was identified and  a midline incision made over the L4-5 level and carried down through the  subcutaneous tissue with bipolar cautery and electrocautery used to maintain  hemostasis.  Dissection was carried down to the lumbar fascia, which was  incised on the left side of the midline and the paraspinal muscles.  We  dissected the spinous processes and laminae in  a subperiosteal fashion.  X-  rays were taken, and we identified the L4-5 interlaminar space.  The  microscope was draped and brought into the field to provide additional  magnification, illumination, and visualization.  The remainder of the  decompression was performed using microdissection and microsurgical  technique.  Laminotomy was performed using the high-speed drill and then  edges of the bone were waxed as necessary.  The ligamentum flavum was  carefully removed, and we identified the thecal sac.  The synovial cyst was  located laterally, and we were able to expose it.  It was moderately  adherent to the thecal sac, and we were able to carefully dissect it from  the thecal sac without disrupting the integrity of the dura.  We were able  to remove the cyst.  Additional thickened ligamentum flavum was also  removed, and we were able to decompress the lateral recess and the exiting  L4 and L5 nerve roots as well as the  thecal sac.  In the end, good  decompression of the neural structures was achieved with complete removal of  the synovial cyst other than for the immediate edges adherent to the dura.  The wound was irrigated with bacitracin solution, checked for hemostasis,  which was established with the use of bipolar cautery as well as Gelfoam  soaked in thrombin.  Once hemostasis was established and confirmed, we  instilled 2 mL of fentanyl and 80 mg of Depo-Medrol into the epidural space  and proceeded with closure.  The deep fascia was closed with interrupted,  undyed 1 Vicryl suture, the subcutaneous and subcuticular layer were closed  with interrupted, inverted 2-0 undyed Vicryl sutures, and the skin edges  were reapproximated with Dermabond.  The  patient tolerated the procedure well.  The estimated blood loss was less  than 25 mL.  Sponge and needle count were correct.  Following surgery the  patient was to be turned back to a supine position, reversed from the  anesthetic,  extubated, and transferred to the recovery room for further  care.                                               Hewitt Shorts, M.D.    RWN/MEDQ  D:  07/16/2003  T:  07/16/2003  Job:  [id number]

## 2010-05-21 NOTE — Discharge Summary (Signed)
[name], April Bennett            ACCOUNT NO.:  [id number]   MEDICAL RECORD NO.:  [id number]          PATIENT TYPE:  INP   LOCATION:  2035                         FACILITY:  [facility]   PHYSICIAN:  April Bennett, M.D. DATE OF BIRTH:  [birth year]   DATE OF ADMISSION:  04/20/2004  DATE OF DISCHARGE:  05/05/2004                                 DISCHARGE SUMMARY   ADMISSION DIAGNOSES:  1.  Shortness of breath.  2.  Substernal chest pain.   DISCHARGE DIAGNOSES:  1.  Hypertension.  2.  Type 2 diabetes mellitus.  3.  Hyperlipidemia.  4.  Gastritis and esophagitis.  5.  Status post L4-5 lumbar laminectomy in 2005.  6.  Hysterectomy.  7.  Bladder resuspension.  8.  Three-vessel coronary artery disease, status post coronary artery bypass      grafting x3.  9.  Mitral regurgitation status post mitral valve repair with a 24 mm      Edwards-Physio ring annuloplasty.  10. Postoperative thrombocytopenia, resolved.  11. Postoperative anemia, resolved.  12. Postoperative intensive care unit psychosis, resolved.  13. Deconditioning, improved.   ALLERGIES:  PENICILLIN which causes a rash.   BRIEF HISTORY:  The patient is a 77 year old Caucasian female with a past  medical history notable for hypertension, type 2 diabetes mellitus, and  hyperlipidemia.  The patient apparently had a previous history of coronary  artery disease, however, over the month prior to admission, she has suffered  from recurrent episodes of substernal chest pain that radiated to her arms  and epigastric region as well as dyspnea.  The episodes have been paroxysmal  and at times somewhat atypical.  She underwent a stress Cardiolite  examination and was evaluated by April Bennett. April Bennett, M.D.  The Cardiolite  examination was felt to be low risk for possible ischemic heart disease.  She also underwent an esophagogastroduodenoscopy and was found to have  evidence of gastritis and esophagitis.  The patient was admitted to  [facility] on April 21, 2004, with severe weakness, dyspnea, and  continued intermittent episodes of atypical chest pain.  She was found to  have class IV congestive heart failure.  She was also found to have a murmur  on physical examination suggestive of possible valvular disease.  Baseline  electrocardiograms were without evidence for acute ST elevation, however,  serial cardiac enzymes were notable for an elevated troponin and mildly to  moderately elevated BNP.  On the after of April 21, 2004, the patient  developed flash pulmonary edema consistent with congestive heart failure and  emergency cardiology consultation was requested.  The patient was evaluated  by April Bennett, M.D. and subsequently taken to the cardiac  catheterization lab.  Findings at the time of the catheterization  demonstrated a critical subtotal 99% distal occlusion of the left main  coronary artery as well as severe three-vessel coronary artery disease and  mitral regurgitation.  The patient developed cardiogenic shock and required  intra-aortic balloon pump placement.  The patient was started on intravenous  dopamine and Levophed for blood pressure support and emergency cardiac  surgical consultation was  requested.  April Bennett, M.D. of the [facility]  service evaluated the patient and it was his opinion that the patient was a  very high risk surgical candidate, however, it would be required for any  chance of survival.  The patient was planned for emergent surgical  intervention.   HOSPITAL COURSE:  The patient was admitted and evaluated on April 21, 2004,  as previously stated.  The patient was then taken to the OR on April 21, 2004, for emergent coronary artery bypass grafting x3 as well as mitral  valve repair.  Left internal mammary artery was grafted to the LAD,  saphenous vein was grafted to the second circumflex marginal branch,  saphenous vein was grafted to the posterior descending  artery, and  endoscopic vessel harvesting was performed of the right lower extremity.  Mitral valve repair was performed with a 24 mm Edwards Physio ring  annuloplasty.  The patient tolerated the procedure well and was stable  postoperatively, however in critical condition.  She was transferred from  the OR to the SICU without difficulty.  The patient was stable throughout  the night of postoperative day and into postoperative day #1.  The patient  was started on amiodarone and her inotropic drips were continued.  She  remained sedated and on the ventilator for the first several days throughout  the postoperative course, however, this was successfully weaned and  discontinued by postoperative day #2.   The patient was noted to be thrombocytopenic as well as anemic on  postoperative day #1.  She was transfused with FFP as well as platelets.  These eventually resolved throughout the postoperative course.  The  patient's intra-aortic balloon pump was also successfully discontinued on  postoperative day #2.   The patient's condition remained stable after extubation and intra-aortic  balloon pump discontinuation.  Her inotropic agents were eventually  successfully weaned and discontinued.  The patient did maintain a normal  sinus rhythm throughout the postoperative course.  All invasive lines and  chest tubes were successfully discontinued without complications.   By postoperative day #5, the patient was mobilizing well and her amiodarone  was switched to p.o.  She was afebrile maintaining a normal sinus rhythm  with stable vital signs.  Her acute renal insufficiency which had been noted  earlier in the postoperative course was resolving.  She was started slowly  on Coumadin on postoperative day #5 for her mitral valve annuloplasty.   On postoperative day #7, the patient was noted to be confused and disoriented.  This was felt to be consistent with ICU psychosis.  Her  confusion continued  for the next several days throughout the postoperative  course, however, it began to resolve and completely resolved within the next  several days.  The patient was successfully transferred from the SICU to the  unit 2000 on postoperative day #9.  Physical therapy and cardiac rehab had  been assisting the patient and she was doing well with, however, she was  deconditioned and therefore further therapy was felt to be necessary.  She  was continued on her Coumadin and amiodarone without difficulty.  She was  also continued on aggressive diuresis secondary to her volume overload.  Secondary to the patient's deconditioning, she was felt to require further  assistance with rehab and was therefore transferred in stable condition on  May 05, 2004, to the rehab unit.  On May 3, the patient's only complaint was  of mild shortness of breath.  She  was afebrile with stable vital signs and  maintaining a normal sinus rhythm.   DISCHARGE PHYSICAL EXAMINATION:  HEART:  Regular rate and rhythm.  LUNGS:  Revealed crackles in the right mid lung down to the base.  ABDOMEN:  Benign.  EXTREMITIES:  Incisions were healing well with the exception of some mild  serous drainage from the right lower extremity and there was 1+ pitting  edema present in the bilateral lower extremities.   The patient was in stable condition and felt to be ready for transfer to the  rehab unit for further aggressive treatment of her deconditioning.   LABORATORY DATA:  BNP on May 05, 2004; sodium 139, potassium 4.3, BUN 32,  creatinine 1.5, glucose 166.  INR on April 05, 2004, was 2.9.  CBC on May 01, 2004; hemoglobin 11.9, hematocrit 34, white count 10, platelets 146.   CONDITION ON TRANSFER:  Stable.   The patient's medications cannot be dictated at this time as there is no MAR  available in the chart.   FOLLOW UP:  The patient's follow-up appointments were arranged with April Bennett. April Bennett, M.D. 2 weeks after discharge from  the hospital and with Dr. Cornelius Bennett  3 weeks after discharge from the hospital with APA and lateral chest x-ray.       AY/MEDQ  D:  07/28/2004  T:  07/28/2004  Job:  [id number]

## 2010-05-21 NOTE — Op Note (Signed)
[name], Bennett            ACCOUNT NO.:  [id number]   MEDICAL RECORD NO.:  [id number]          PATIENT TYPE:  INP   LOCATION:  2399                         FACILITY:  [facility]   PHYSICIAN:  Salvatore Decent. Cornelius Moras, M.D. DATE OF BIRTH:  1922-02-09   DATE OF PROCEDURE:  04/21/2004  DATE OF DISCHARGE:                                 OPERATIVE REPORT   PREOPERATIVE DIAGNOSIS:  Critical left main coronary artery stenosis with  three-vessel coronary artery disease, acute evolving myocardial infarction  with cardiogenic shock, severe mitral regurgitation and class IV congestive  heart failure.   POSTOPERATIVE DIAGNOSIS:  Critical left main coronary artery stenosis with  three-vessel coronary artery disease, acute evolving myocardial infarction  with cardiogenic shock, severe mitral regurgitation and class IV congestive  heart failure.   PROCEDURE:  Emergency median sternotomy for coronary artery bypass grafting  x3 (left internal mammary artery to distal left anterior descending coronary  artery, saphenous vein graft to second circumflex marginal branch, saphenous  vein graft to posterior descending coronary artery, endoscopic saphenous  vein harvest from right thigh) and mitral valve repair (posterior commissure  plasty x2 with 24 mm Edwards physio ring annuloplasty).   SURGEON:  Salvatore Decent. Cornelius Moras, M.D.   ASSISTANT:  Jerold Coombe, P.A.   ANESTHESIA:  General.   CLINICAL NOTE:  The patient is a 77 year old female seen emergently in the  cardiac cath lab the late afternoon on April19 in cardiogenic shock with  acute evolving myocardial infarction and subtotal occlusion of the distal  left main coronary artery with severe mitral regurgitation. A full  consultation note has been dictated previously. Operative consent is  obtained from the patient's husband and family and details have been  dictated previously. The patient is now brought directly from the cardiac  cath lab to the  operating room for emergency surgical intervention.   OPERATIVE FINDINGS:  1.  Preoperative cardiogenic shock with akinesis of the distal anterior wall      and lateral wall consistent with subtotal occlusion of left main      coronary artery and severe mitral regurgitation  2.  Relatively small but good-quality targets for bypass grafting.  3.  Relatively small conduit but good quality left internal mammary artery.  4.  Marked improvement in left ventricular function following      revascularization.  5.  No residual mitral regurgitation after successful mitral valve repair.   OPERATIVE NOTE IN DETAIL:  The patient is brought directly from the cath lab  to the operating room and placed in the supine position on the operating  table. The existing intra-aortic balloon pump was repositioned to facilitate  preparation of the patient. The patient was rapidly intubated and general  endotracheal anesthesia induced under the care and direction of Dr. Jairo Ben. A Swan-Ganz catheter subsequently placed by Dr. Jean Rosenthal through the  left internal jugular approach. A left radial arterial line was placed. The  patient's existing femoral artery sheath via the left common femoral artery  was also utilized for monitoring purposes. An additional intravenous access  was obtained and a Foley catheter was placed.  The patient's chest, abdomen,  both groins, and both lower extremities were prepared, draped in sterile  manner. Intravenous antibiotics were administered.   Baseline transesophageal echocardiogram was performed by Dr. Jean Rosenthal. This  demonstrates dilated left ventricle with akinesis of the distal anterior  wall and lateral wall consistent with acute ischemia or infarction involving  the entire left main coronary distribution. The inferior wall appears to be  contracting. There is severe (4+) mitral regurgitation. This consists of a  broad central jet of mitral regurgitation that fills the  entire left atrium.  There is restriction of the posterior leaflet. There is no evidence of any  prolapse of the mitral valve nor other signs of any ruptured cords or other  mechanical abnormalities. The aortic valve appears normal.   Median sternotomy incision was performed and left internal mammary artery  was rapidly taken down from behind the breast bone and prepared for bypass  grafting. Simultaneously saphenous vein was obtained to be utilized for  conduit for grafting using endoscopic vein harvest technique through a small  incision made just above the right knee. The saphenous vein is small caliber  but fair quality. The left internal mammary artery is small caliber but good-  quality with excellent flow. The patient is heparinized systemically.   The pericardium was opened. The ascending aorta is normal in appearance. The  ascending aorta is cannulated for cardiopulmonary bypass. The venous cannula  was placed directly as in the superior vena cava. Cardiopulmonary bypass was  begun. A second venous cannula is placed low in the right atrium with tip  extending down the inferior vena cava. An attempt was made to place a  retrograde cardioplegic catheter, but the catheter appears to be catching on  a valve overlying the orifices of the coronary sinus and placement  subsequently postponed. Vessel loops were placed around the superior vena  cava and inferior vena cava. Distal sites were selected for coronary bypass  grafting. A temperature probe was placed in left ventricular septum.  Cardioplegic catheter is placed in the ascending aorta.   The patient is allowed to cool passively to 28 degrees systemic temperature.  The aortic crossclamp was applied and cardioplegia is delivered initially in  antegrade fashion through the aortic root. Iced saline slush was applied for  topical hypothermia. The initial cardioplegic arrest and myocardial cooling are felt to be excellent. The vessel  loops were then snared down around the  superior vena cava and inferior vena cava, and a small incision was made in  the right atrium. The retrograde cardioplegic catheter was now placed under  direct vision into the coronary sinus. An additional cardioplegia was  administered retrograde to the coronary sinus catheter. Repeat doses of  cardioplegia are administered intermittently throughout the crossclamp  portion of the operation both through the aortic root, down subsequently  placed vein grafts, and retrograde through the coronary sinus catheter to  maintain septal temperature less than 15 degrees centigrade.   The following distal coronary anastomoses were performed:  1.  The posterior descending coronary artery is grafted with a saphenous      vein graft in end-to-side fashion. This vessel measures 1.5 mm in      diameter and is of fair to good quality target.  2.  The second circumflex marginal branch is grafted with a saphenous vein      graft in end-to-side fashion. This vessel measures 1.5 mm in diameter      and is a fair to  good quality target.  3.  The distal left anterior descending coronary artery is grafted with left      internal mammary artery in end-to-side fashion. This vessel measures 1.5      mm in diameter and is a good-quality target.   The left atriotomy incision is performed posteriorly through the intra-  atrial groove. The mitral valve was exposed using a self-retaining  retractor. Exposure was felt to be excellent. The mitral valve was carefully  examined. There is considerable sclerosis involving the posterior leaflet.  There is some calcification in the posterior annulus. However, the leaflet  itself was not heavily calcified and there remains somewhat mobile although  sclerotic and somewhat restricted. The anterior leaflet is mildly sclerotic.  The subvalvular apparatus is otherwise normal.   Ring annuloplasty was performed using interrupted 2-0 Ethibond  horizontal  mattress sutures placed circumferentially around the mitral valve annulus.  The mitral valve was subsequently sized to accept a 26 mm annuloplasty ring.  A 26 mm ring appears to correspond to the intertrigonal distance. The  leaflet size of the anterior leaflet is relatively small. A 26 mm Edwards  physio ring (model number 4450, serial number [id number], was subsequently  secured in place. After completion of annuloplasty the valve was then tested  for competency. However, there remains still some partially incomplete  coaptation of the leaflets despite a nice, symmetrical annuloplasty. This  appears to be related to a combination of the fact that the anterior leaflet  is relatively small as well as the fact that the posterior leaflet remains  somewhat restricted. This annuloplasty was subsequently removed to  facilitate downsizing one further. In addition, a large cleft in the  posterior leaflet is closed with commissure plasty stitches consisting of the everting 4-0 Ethibond suture. This was performed in two locations  consisting of short commissure plasty to improve the functional closure of  the posterior leaflet. Repeat ring annuloplasty now performed using  interrupted 2-0 Ethibond horizontal mattress sutures placed  circumferentially around the mitral annulus. An Edwards physio ring size 24  mm (model number 4450, serial number [id number]) is now secured in place. After  completion of the mitral valve repair, the valve was again tested and  appears to be perfectly competent with no residual mitral regurgitation.  Rewarming was begun.   The left atrial appendage was oversewn from within the left atrium using a  two-layer closure of running 3-0 Prolene suture. The left atriotomy was now  closed using a two-layer closure of running 3-0 Prolene suture.   Both proximal saphenous vein anastomoses were performed directly to the  ascending aorta prior to removal of the aortic  crossclamp. Left ventricular  septal temperature rises rapidly with reperfusion of the left internal  mammary artery. One final dose of warm retrograde hot shot cardioplegia is  administered. Aortic crossclamp was removed after total crossclamp time of  143 minutes.   The heart began to beat spontaneously without need for cardioversion. The  retrograde cardioplegic catheter was removed. The right atriotomy incision  is closed with a two-layer closure of running Prolene suture. Epicardial  pacing wires were fixed to the right ventricular outflow tract and to the  right atrial appendage. All proximal and distal coronary anastomoses are  inspected for hemostasis and appropriate graft orientation. The aortic root  was vented using Magoon needle.   The patient is rewarmed to 37 degrees centigrade temperature. During this  portion of the procedure the patient is rested on bypass  and the patient is  loaded with intravenous milrinone and begun on epinephrine infusion. IVC  cannula was now removed and ventilation begun. After ascertaining that no  significant residual air is present in the left heart circulation, the  aortic root vent is removed.   The patient weaned from cardiopulmonary bypass without difficulty. The  patient's rhythm at separation from bypass is normal sinus rhythm. Total  cardiopulmonary bypass time for the operation was 193 minutes. The patient  is weaned from bypass on milrinone, low-dose dopamine, epinephrine, and high-  dose Neo-Synephrine. The patient's initial cardiac outputs after separation  from bypass are measured greater than 4.5-5.0 liters per minute. Follow-up  transesophageal echocardiogram performed by Dr. Noreene Larsson after separation from  bypass demonstrates marked improvement in left ventricular function. There  is a well seated mitral annuloplasty ring. There is no residual mitral  regurgitation. There remained some hypokinesis of the distal anterior wall and  apex, but overall left ventricular function is markedly improved in  comparison with the pre-bypass echocardiogram. No other abnormalities were  noted.   The patient remained fairly stable throughout the remainder of the  operation. The patient does remain vaso dilated with relatively high cardiac  outputs and high alpha blocking agent requirements. The intra-aortic balloon  pump was maintained at 1:1 ratio.  Milrinone was discontinued due to the  vasodilitation.   The SVC cannula was removed. The aortic root vent was removed. The aortic  cannula was removed and the cannulation site verified intact. Protamine was  administered to reverse the anticoagulation. The mediastinum, the left  pleural space were now irrigated with saline solution containing vancomycin.  The patient is transfused one pack of adult platelets and 2 units fresh  frozen plasma for coagulopathy after reversal of heparin with protamine. The  patient received 3 units packed red blood cells during cardiopulmonary  bypass due to anemia which was present preoperatively and exacerbated by  surgery.   The mediastinum and both pleural spaces were now drained using four chest  tubes exited through separate stab incisions inferiorly. It should be noted  the patient had large pleural effusions on both sides at the beginning of  the operation. The median sternotomy is now closed in routine fashion. The  soft tissues anterior to the sternum are closed in multiple layers and the  skin was closed with running subcuticular skin closure.   The patient tolerated the procedure remarkably well, despite presentation  and cardiogenic shock preoperatively. She remained stable following  separation from bypass and throughout the remainder of the operation. She  was transported to the surgical intensive care unit in stable condition.  There are no intraoperative complications. All sponge, instrument and needle  counts were verified correct  at completion of the operation.      Salvatore Decent. Cornelius Moras, M.D.     CHO/MEDQ  D:  04/22/2004  T:  04/22/2004  Job:  [id number]   cc:   Vesta Mixer, M.D.  Fax: [phone]   Colleen Can. Deborah Chalk, M.D.  Fax: [phone]   Madaline Guthrie, M.D.  Fax: [phone]

## 2010-05-21 NOTE — Discharge Summary (Signed)
NAMEMATTILYNN, FORRER            ACCOUNT NO.:  [id number]   MEDICAL RECORD NO.:  [id number]          PATIENT TYPE:  ORB   LOCATION:  4522                         FACILITY:  [facility]   PHYSICIAN:  Erick Colace, M.D.DATE OF BIRTH:  Apr 20, 1922   DATE OF ADMISSION:  05/05/2004  DATE OF DISCHARGE:  05/14/2004                                 DISCHARGE SUMMARY   DISCHARGE DIAGNOSES:  1.  Deconditioning secondary to coronary artery disease requiring coronary      artery bypass graft with mitral valve replacement.  2.  Diabetes mellitus type 2.  3.  Pleural effusion, resolving.  4.  Congestive heart failure, compensated.  5.  Renal insufficiency, resolved.  6.  Anxiety with insomnia, improved.   HISTORY OF PRESENT ILLNESS:  Ms. Sloniker is a 77 year old female with  history of diabetes type 2, angina approximately a year, admitted for  increase in shortness of breath and weakness.  Noted to have positive  cardiac enzymes and was transferred to [facility] for treatment.  Patient developed flash pulmonary edema April 19.  Emergent cardiac  catheterization was done by Dr. Elease Hashimoto showing three vessel disease with  mitral valve regurgitation.  Patient admitted to cardiogenic shock post  procedure requiring IABP and IV dopamine.  Emergent CABG x3 with mitral  valve replacement done same day by Dr. Cornelius Moras.  Postoperative patient has had  some issues with confusion intermittently.  She has had some issues with  shortness of breath and dyspnea requiring 3-4 L of O2.  Other issues have  included problems with renal insufficiency and abnormal LFTs.  Therapies  were initiated and patient noted to have decreased endurance requiring  frequent rest breaks during mobility.  She is currently at min assist for  bed mobility, supervision to min assist transfers, min to guard assist  ambulating 300 feet.  SACU was consulted for progressive therapies.   PAST MEDICAL HISTORY:  1.   Hysterectomy.  2.  Bladder repair.  3.  Lumbar surgery in 2005.  4.  DM type 2 x10 years.  5.  Hypertension.  6.  History of gastritis.  7.  Esophagitis.  8.  Gout.   ALLERGIES:  PENICILLIN.   SOCIAL HISTORY:  Patient is married, but has had decrease in endurance level  past year.  She does not use any tobacco or alcohol.  Lives in one-level  home with four steps at entry.   HOSPITAL COURSE:  Ms. Eufelia Veno was admitted to [facility] on May 05, 2004 for  SACU level therapies to consist of PT/OT daily.  Past admission she was  maintained on Coumadin throughout her stay.  Pain was managed with p.r.n.  use of Tylenol.  Patient's blood sugars were monitored on a.c. and h.s.  basis and were noted to be elevated.  Blood sugars were noted to be trending  upward at 200.  Patient's Glucotrol XL has been adjusted to 10 mg in a.m., 5  mg in p.m. with blood sugars at 100-160s range in the past 24 hours.  Blood  pressures have been well controlled.  Lisinopril has been held secondary  to  BP trend from 100-120s systolic, 40s-60s diastolic.  Follow-up laboratories  done past admission shows patient postoperative anemia to be resolving with  hemoglobin 11.1, hematocrit 32.8, white count 10.8, platelets 156.  Check of  lytes initially revealed some renal insufficiency with BUN 26, creatinine  1.3, recheck last of May 10 shows sodium 136, potassium 4.1, chloride 99,  CO2 41, BUN 18, creatinine 1.3, glucose 111.  Abnormal LFTs are much  improved overall with AST of 33, ALT 39, alkaline phosphatase 104, Tbili  1.3.  A UC was done past admission and patient's urine grew out greater than  100,000 colonies of E. coli and Klebsiella.  Patient was treated with Keflex  for UTI and has one additional day of antibiotic therapy past discharge.   The patient's chest wall incision has healed well without any signs or  symptoms of infection.  Her right lower extremity donor graft site continues  with minimal serous  drainage and patient to continue with dry dressing on  this area for now.  Patient has shown high anxiety level during her stay.  Xanax h.s. was scheduled to help with her insomnia problems.  Patient was  weaned off O2 during her stay.  By time of discharge she had progressed to  be modified independent level for transfers, modified independent level for  ambulating 250 feet with rolling walker.  She is at supervision level for  ADLs.  Family is to provide assistance as needed past discharge.  Home  health PT/OT R.N. per [facility] past discharge on May 14, 2004.  Patient is discharged to home.   DISCHARGE MEDICATIONS:  1.  Coumadin 3 mg p.o. q.p.m.  2.  Glucotrol XL 10 mg in a.m., 5 mg in p.m.  3.  K-Dur 20 mEq daily.  4.  Toprol XL 25 mg daily.  5.  Lasix 40 mg b.i.d.  6.  Cordarone 200 mg a day.  7.  Multivitamin one per day.  8.  Protonix 40 mg a day.  9.  Actos 30 mg a day.  10. Premarin 0.3 mg a day.  11. Aldactone 25 mg a day.  12. Lipitor 10 mg q.h.s.  13. Keflex 250 mg q.i.d. for one additional day.   DIET:  To have modified moderate, low salt.   ACTIVITY:  As tolerated with use of walker.  Family to assist as needed.   SPECIAL INSTRUCTIONS:  Continue monitoring blood sugars a.c./h.s. basis.  Home health R.N. to draw pro times on Monday with results to Dr. Elease Hashimoto.  Dr. Harvie Bridge office to adjust Coumadin as needed.   FOLLOW-UP:  Patient to follow up with Dr. Elease Hashimoto on Wednesday, May 17 at  1:30 p.m.  Follow up with Dr. Cornelius Moras in one month.  Follow up with primary  care for routine check.  Follow up with Dr. Wynn Banker as needed.      PP/MEDQ  D:  05/14/2004  T:  05/15/2004  Job:  [id number]   cc:   Salvatore Decent. Cornelius Moras, M.D.  [address]  [place]  [place] [address]   Vesta Mixer, M.D.  [address], [address]  [place]  [place] [address]  Fax: [phone]   Madaline Guthrie, M.D.  [address] [place] [address]  [place] [address]  Fax: [phone]

## 2010-08-27 ENCOUNTER — Telehealth: Payer: Self-pay | Admitting: Cardiovascular Disease

## 2010-08-27 NOTE — Telephone Encounter (Signed)
Called pt to reschedule appt 10/04/10 no answer

## 2010-10-04 ENCOUNTER — Ambulatory Visit: Payer: BLUE CROSS/BLUE SHIELD | Admitting: Cardiovascular Disease

## 2010-10-15 NOTE — Telephone Encounter (Signed)
No additional info.  Opened in error

## 2010-11-16 ENCOUNTER — Ambulatory Visit (INDEPENDENT_AMBULATORY_CARE_PROVIDER_SITE_OTHER): Payer: Medicare Other | Admitting: Cardiovascular Disease

## 2010-11-16 ENCOUNTER — Encounter: Payer: Self-pay | Admitting: Cardiovascular Disease

## 2010-11-16 VITALS — BP 116/62 | HR 65 | Ht 65.0 in | Wt 147.0 lb

## 2010-11-16 DIAGNOSIS — I2510 Atherosclerotic heart disease of native coronary artery without angina pectoris: Secondary | ICD-10-CM

## 2010-11-16 NOTE — Patient Instructions (Signed)
Your physician wants you to follow-up in:  6 months. You will receive a reminder letter in the mail two months in advance. If you don't receive a letter, please call our office to schedule the follow-up appointment.   

## 2010-11-16 NOTE — Progress Notes (Signed)
  April Bennett Date of Birth  September 18, 1922 [facility] [address]    [address] [place], [place]  [address] [phone]  Fax  [phone]  History of Present Illness:  April Bennett is an 77 year old female with a history of coronary artery disease. She status post coronary artery bypass grafting with mitral valve repair. She has a history of pulmonary hypertension, diabetes mellitus, hyperlipidemia, and peripheral neuropathy.  She had persistant pleural effusions following her CABG that have gradually resolved.  Since I last saw her she fell over a rug and broke several toes in her left foot.  She's not had any cardiac complaints.  Current Outpatient Prescriptions on File Prior to Visit  Medication Sig Dispense Refill  . allopurinol (ZYLOPRIM) 100 MG tablet Take 100 mg by mouth daily.        Marland Kitchen aspirin 81 MG tablet Take 81 mg by mouth daily.        . insulin NPH (HUMULIN N,NOVOLIN N) 100 UNIT/ML injection Inject 20 Units into the skin daily.        Marland Kitchen losartan (COZAAR) 100 MG tablet Take 100 mg by mouth daily.        Marland Kitchen lovastatin (ALTOPREV) 40 MG 24 hr tablet Take 40 mg by mouth at bedtime.        . metoprolol (LOPRESSOR) 50 MG tablet Take 25 mg by mouth daily.        . Multiple Vitamin (MULTIVITAMIN) tablet Take 1 tablet by mouth daily.        Marland Kitchen omeprazole (PRILOSEC) 20 MG capsule Take 20 mg by mouth 2 (two) times daily.        . potassium chloride (K-DUR,KLOR-CON) 10 MEQ tablet Take 10 mEq by mouth 2 (two) times daily.          Allergies  Allergen Reactions  . Amiodarone     Past Medical History  Diagnosis Date  . Pulmonary HTN   . Diabetes mellitus   . Hyperlipidemia   . Peripheral neuropathy   . Hypertension   . CHF (congestive heart failure)   . Coronary artery disease     MI; S/P MI April 2006, CABG    Past Surgical History  Procedure Date  . Coronary artery bypass graft   . Mitral valve repair   . Abdominal hysterectomy   . Bladder repair   .  Lumbar disc surgery     2005  . Gastritis   . Esophagitis   . Gout     History  Smoking status  . Never Smoker   Smokeless tobacco  . Not on file    History  Alcohol Use No    No family history on file.  Reviw of Systems:  Reviewed in the HPI.  All other systems are negative.  Physical Exam: BP 116/62  Pulse 65  Ht 5\' 5"  (1.651 m)  Wt 147 lb (66.679 kg)  BMI 24.46 kg/m2 The patient is alert and oriented x 3.  The mood and affect are normal.   Skin: warm and dry.  Color is normal.    HEENT:   [place]/AT, normal carotids, no JVD  Lungs: clear   Heart: RR, soft systolic murmur    Abdomen: soft , + BS  Extremities:  Left foot is in a cast  Neuro:  Non focal    ECG: NSR, 1st degree block,   Assessment / Plan:

## 2010-11-16 NOTE — Assessment & Plan Note (Signed)
She is status post coronary bypass grafting . She overall seems to be doing well from a cardiac standpoint. We'll continue with her same medications.

## 2011-02-18 ENCOUNTER — Telehealth: Payer: Self-pay | Admitting: Cardiovascular Disease

## 2011-02-18 NOTE — Telephone Encounter (Signed)
New Problem   Patient Daughter April Bennett [phone], called to report patient is having numbness in both hands and she doesn't feel good. Patient would like to come in and see Dr. Elease Hashimoto today if possible, No SOB or Dizziness.   Please return call to [facility] at [phone]

## 2011-02-18 NOTE — Telephone Encounter (Signed)
Discussed pt issues with dr wall, does not sound like a cardiac issue. The pt will call dr Johnston Ebbs or go to urgent care.

## 2011-02-18 NOTE — Telephone Encounter (Signed)
Spoke with pt dtr, she reports the pt got up this morning and is complaining of not feeling right. She was shaking all over and broke out in a sweat and c/o her hands being numb. At present she cont to have hand numbness but no cp or SOB. They have checked her glucose which was 119 and her bp is 148/69. She has never had this type of symptom before her previous CABG in 2007. Aware dr Elease Hashimoto is not here, the family request I speak with the DOD.

## 2011-05-16 ENCOUNTER — Encounter: Payer: Self-pay | Admitting: Cardiovascular Disease

## 2011-05-16 ENCOUNTER — Ambulatory Visit (INDEPENDENT_AMBULATORY_CARE_PROVIDER_SITE_OTHER): Payer: Medicare Other | Admitting: Cardiovascular Disease

## 2011-05-16 VITALS — BP 126/62 | HR 64 | Ht 65.0 in | Wt 142.8 lb

## 2011-05-16 DIAGNOSIS — I509 Heart failure, unspecified: Secondary | ICD-10-CM

## 2011-05-16 DIAGNOSIS — I2510 Atherosclerotic heart disease of native coronary artery without angina pectoris: Secondary | ICD-10-CM

## 2011-05-16 NOTE — Patient Instructions (Signed)
Your physician recommends that you return for a FASTING lipid profile: TODAY AND IN 6 MONTH  Your physician wants you to follow-up in: 6 MONTHS You will receive a reminder letter in the mail two months in advance. If you don't receive a letter, please call our office to schedule the follow-up appointment.

## 2011-05-16 NOTE — Assessment & Plan Note (Addendum)
She is doing well from a cardiac standpoint.  She's not had any specific symptoms of congestive heart failure. Has had some problems with balance but I think that this is due to her age. I don't think that she's having orthostasis.  I've encouraged her to use her walker or a four-legged cane at all times. I've asked her family to relocate items that are difficult for her to reach. We'll check fasting labs today. I'll see her back in 6 months for office visit on EKG, and fasting labs.

## 2011-05-16 NOTE — Assessment & Plan Note (Signed)
Stable, no angina  

## 2011-05-16 NOTE — Progress Notes (Signed)
[name] Date of Birth  06-Nov-1922       [facility]    [place] [place] [address]  [address] [place], [place]  [address]   [place], [place]  [address] [phone]     [phone]   Fax  [phone]    Fax [phone]  Problem List: 1. Coronary artery disease-status post CABG 2. History of mitral valve 3. Pulmonary hypertension 4. Diabetes mellitus 5. Hyperlipidemia 6. Peripheral neuropathy  History of Present Illness:  April Bennett is doing quite well since I last saw her 6 months ago. She's not had any episodes of chest pain or shortness breath. She has been having some balance issues. Her family has found her on the floor when she leans over to do some housework. For example she lost her balance when reaching into the cabinet for a can of beans. She also lost her balance 1 time when hanging up clothes on the rack in the closet.  She typicall walks with the assistance of a walker. These falling episodes have occurred when she left her walker behind.  Current Outpatient Prescriptions on File Prior to Visit  Medication Sig Dispense Refill  . allopurinol (ZYLOPRIM) 100 MG tablet Take 100 mg by mouth daily.        Marland Kitchen aspirin 81 MG tablet Take 81 mg by mouth daily.        . finasteride (PROSCAR) 5 MG tablet Take 5 mg by mouth daily.        Marland Kitchen gabapentin (NEURONTIN) 100 MG capsule Take 100 mg by mouth 3 (three) times daily.        . insulin NPH (HUMULIN N,NOVOLIN N) 100 UNIT/ML injection Inject 20 Units into the skin daily.        Marland Kitchen losartan (COZAAR) 100 MG tablet Take 100 mg by mouth daily.        Marland Kitchen lovastatin (ALTOPREV) 40 MG 24 hr tablet Take 40 mg by mouth at bedtime.        . metoprolol (LOPRESSOR) 50 MG tablet Take 25 mg by mouth daily.        . Multiple Vitamin (MULTIVITAMIN) tablet Take 1 tablet by mouth daily.        Marland Kitchen omeprazole (PRILOSEC) 20 MG capsule Take 20 mg by mouth 2 (two) times daily.        . potassium  chloride (K-DUR,KLOR-CON) 10 MEQ tablet Take 10 mEq by mouth 2 (two) times daily.          Allergies  Allergen Reactions  . Amiodarone     Past Medical History  Diagnosis Date  . Pulmonary HTN   . Diabetes mellitus   . Hyperlipidemia   . Peripheral neuropathy   . Hypertension   . CHF (congestive heart failure)   . Coronary artery disease     MI; S/P MI April 2006, CABG    Past Surgical History  Procedure Date  . Coronary artery bypass graft   . Mitral valve repair   . Abdominal hysterectomy   . Bladder repair   . Lumbar disc surgery     2005  . Gastritis   . Esophagitis   . Gout     History  Smoking status  . Never Smoker   Smokeless tobacco  . Not on file    History  Alcohol Use No    No family history on file.  Reviw of Systems:  Reviewed in the HPI.  All  other systems are negative.  Physical Exam: Blood pressure 126/62, pulse 64, height 5\' 5"  (1.651 m), weight 142 lb 12.8 oz (64.774 kg). General: Well developed, well nourished, in no acute distress.  She was examined in the wheelchair.  Head: Normocephalic, atraumatic, sclera non-icteric, mucus membranes are moist,   Neck: Supple. Carotids are 2 + without bruits. No JVD  Lungs: Clear bilaterally to auscultation.  Heart: regular rate.  normal  S1 S2. No murmurs, gallops or rubs.  Abdomen: Soft, non-tender, non-distended with normal bowel sounds. No hepatomegaly. No rebound/guarding. No masses.  Msk:  Strength and tone are normal.  She has lots of bruising on his arms.  Extremities: No clubbing or cyanosis. No edema.  Distal pedal pulses are 2+ and equal bilaterally.  Neuro: Alert and oriented X 3. Moves all extremities spontaneously.  Psych:  Responds to questions appropriately with a normal affect.  ECG:  Assessment / Plan:

## 2011-05-17 LAB — HEPATIC FUNCTION PANEL
Alkaline Phosphatase: 54 U/L (ref 39–117)
Total Bilirubin: 0.8 mg/dL (ref 0.3–1.2)

## 2011-05-17 LAB — BASIC METABOLIC PANEL
CO2: 27 mEq/L (ref 19–32)
Chloride: 106 mEq/L (ref 96–112)
Glucose, Bld: 85 mg/dL (ref 70–99)
Potassium: 4.2 mEq/L (ref 3.5–5.1)
Sodium: 140 mEq/L (ref 135–145)

## 2011-05-17 LAB — LIPID PANEL
LDL Cholesterol: 67 mg/dL (ref 0–99)
Triglycerides: 45 mg/dL (ref 0.0–149.0)

## 2011-08-22 ENCOUNTER — Telehealth: Payer: Self-pay | Admitting: Cardiovascular Disease

## 2011-08-22 DIAGNOSIS — M79602 Pain in left arm: Secondary | ICD-10-CM

## 2011-08-22 NOTE — Telephone Encounter (Signed)
Follow-up:   Patient's daughter called in because her mother was called with instructions on how to proceed but was confused after the call.  Called back to find out what was discussed.  Please call back.

## 2011-08-22 NOTE — Telephone Encounter (Signed)
LET [facility] KNOW THAT THEY WERE CALLING BACK, P[facility] WILL RETURN CALL.

## 2011-08-22 NOTE — Telephone Encounter (Signed)
Patient's daughter called because for the last 2 months patient has been having pain in her left arm starting in her shoulder. Pt was seen by Dr. Samuel Bouche in [place]  about 3 weeks ago, and gave her an injection in her shoulder  and said pt has tendonitis. The pain has not improved. Daughter states pt has  never C/O of chest pain only shoulder and arm pain, no other symptoms. Daughter wants to make sure because ,it could be her heart.

## 2011-08-22 NOTE — Telephone Encounter (Addendum)
Pt is having left arm pain and up in her shoulder and wants to talk to someone regarding this to make sure it's not her heart. Pt wants to be seen today.

## 2011-08-22 NOTE — Telephone Encounter (Signed)
Called pt x 2, phone has a busy signal.

## 2011-08-22 NOTE — Telephone Encounter (Signed)
Fu call Pt's daughter called again

## 2011-08-22 NOTE — Telephone Encounter (Signed)
Discussed S/S with Dr Elease Hashimoto, lexiscan ordered. Family accepting of plan, we will call her and schedule. Pt denies cp, sob, only having left arm pain and had shot for tendinitis without relief.

## 2011-08-24 ENCOUNTER — Ambulatory Visit (HOSPITAL_COMMUNITY): Payer: Medicare Other | Attending: Cardiovascular Disease | Admitting: Radiology

## 2011-08-24 VITALS — BP 121/75 | Ht 65.0 in | Wt 132.0 lb

## 2011-08-24 DIAGNOSIS — R42 Dizziness and giddiness: Secondary | ICD-10-CM | POA: Insufficient documentation

## 2011-08-24 DIAGNOSIS — R0602 Shortness of breath: Secondary | ICD-10-CM

## 2011-08-24 DIAGNOSIS — M79602 Pain in left arm: Secondary | ICD-10-CM

## 2011-08-24 DIAGNOSIS — I10 Essential (primary) hypertension: Secondary | ICD-10-CM | POA: Insufficient documentation

## 2011-08-24 DIAGNOSIS — E119 Type 2 diabetes mellitus without complications: Secondary | ICD-10-CM | POA: Insufficient documentation

## 2011-08-24 DIAGNOSIS — I4949 Other premature depolarization: Secondary | ICD-10-CM

## 2011-08-24 DIAGNOSIS — E785 Hyperlipidemia, unspecified: Secondary | ICD-10-CM | POA: Insufficient documentation

## 2011-08-24 MED ORDER — TECHNETIUM TC 99M TETROFOSMIN IV KIT
10.0000 | PACK | Freq: Once | INTRAVENOUS | Status: AC | PRN
  Administered 2011-08-24: 10 via INTRAVENOUS

## 2011-08-24 MED ORDER — REGADENOSON 0.4 MG/5ML IV SOLN
0.4000 mg | Freq: Once | INTRAVENOUS | Status: AC
  Administered 2011-08-24: 0.4 mg via INTRAVENOUS

## 2011-08-24 MED ORDER — TECHNETIUM TC 99M TETROFOSMIN IV KIT
30.0000 | PACK | Freq: Once | INTRAVENOUS | Status: AC | PRN
  Administered 2011-08-24: 30 via INTRAVENOUS

## 2011-08-24 NOTE — Progress Notes (Signed)
[facility] SITE 3 NUCLEAR MED [address] [place] [address] [phone]  Cardiology Nuclear Med Study  April Bennett is a 78 y.o. female     MRN : [id number]     DOB: 1922/09/05  Procedure Date: 08/24/2011  Nuclear Med Background Indication for Stress Test:  Evaluation for Ischemia and Graft Patency History:  CHF, 2006 MPS: (-) ischemia NL LVF, MI-cath-CABG-04/21/04 Heart Cath: Severe LM Cardiogenic Shock-CABG-MVR EF: 45-50% severe Hypokinesis  Cardiac Risk Factors: Hypertension, IDDM Type 2 and Lipids  Symptoms:  Dizziness, DOE and SOB   Nuclear Pre-Procedure Caffeine/Decaff Intake:  None > 12 hrs NPO After: 6:00pm   Lungs:  clear O2 Sat: 97% on room air. IV 0.9% NS with Angio Cath:  22g  IV Site: R Antecubital x 1, tolerated well IV Started by:  Irean Hong, RN  Chest Size (in):  34 Cup Size: B  Height: 5\' 5"  (1.651 m)  Weight:  132 lb (59.875 kg)  BMI:  Body mass index is 21.97 kg/(m^2). Tech Comments:  Patient took lopressor this am, and 30 min. PC BS was 151 at 9:18 am.    Nuclear Med Study 1 or 2 day study: 1 day  Stress Test Type:  Eugenie Birks  Reading MD: Marca Ancona, MD  Order Authorizing Provider:  Kristeen Miss, MD  Resting Radionuclide: Technetium 76m Tetrofosmin  Resting Radionuclide Dose: 11.0 mCi   Stress Radionuclide:  Technetium 19m Tetrofosmin  Stress Radionuclide Dose: 33.0 mCi           Stress Protocol Rest HR: 87 Stress HR: 104  Rest BP: 121/75 Stress BP: 130/64  Exercise Time (min): n/a METS: n/a   Predicted Max HR: 133 bpm % Max HR: 78.2 bpm Rate Pressure Product: 46962   Dose of Adenosine (mg):  n/a Dose of Lexiscan: 0.4 mg  Dose of Atropine (mg): n/a Dose of Dobutamine: n/a mcg/kg/min (at max HR)  Stress Test Technologist: Milana Na, EMT-P  Nuclear Technologist:  Domenic Polite, CNMT     Rest Procedure:  Myocardial perfusion imaging was performed at rest 45 minutes following the intravenous  administration of Technetium 70m Tetrofosmin. Rest ECG: SR with PVCS and VBigeminy and VTrigeminy  Stress Procedure:  The patient received IV Lexiscan 0.4 mg over 15-seconds.  Technetium 55m Tetrofosmin injected at 30-seconds.  There were no significant changes, nausea, dizziness, and freq pvcs with Lexiscan.  Quantitative spect images were obtained after a 45 minute delay. Stress ECG: No significant change from baseline ECG  QPS Raw Data Images:  Normal; no motion artifact; normal heart/lung ratio. Stress Images:  Normal homogeneous uptake in all areas of the myocardium. Rest Images:  Normal homogeneous uptake in all areas of the myocardium. Subtraction (SDS):  There is no evidence of scar or ischemia. Transient Ischemic Dilatation (Normal <1.22):  0.93 Lung/Heart Ratio (Normal <0.45):  0.36  Quantitative Gated Spect Images QGS EDV:  43 ml QGS ESV:  14 ml  Impression Exercise Capacity:  Lexiscan with no exercise. BP Response:  Normal blood pressure response. Clinical Symptoms:  Dizzy, nauseated ECG Impression:  Atrial fibrillation with occasional PVCs.  Comparison with Prior Nuclear Study: No images to compare  Overall Impression:  Normal stress nuclear study.  LV Ejection Fraction: 68%.  LV Wall Motion:  NL LV Function; NL Wall Motion  Marca Ancona 08/24/2011

## 2011-08-26 ENCOUNTER — Telehealth: Payer: Self-pay | Admitting: Cardiovascular Disease

## 2011-08-26 NOTE — Telephone Encounter (Signed)
Pt's dtr calling back, the n umber should be [phone]

## 2011-08-26 NOTE — Telephone Encounter (Signed)
Attempted to call pt's daughter April Bennett several times, phone is disconnected. I left a message on the phone provided on pt's records to call back.

## 2011-08-26 NOTE — Telephone Encounter (Signed)
Follow-up:   Patient's daughter called again wanting to know the results of her mothers Lexiscan.  Please call back.

## 2011-08-26 NOTE — Telephone Encounter (Signed)
April Bennett patient's daughter from [place] wants to talk about the patients cath and how it went and she wants a call back asap so she doesn't have to leave late to get back home

## 2011-08-26 NOTE — Telephone Encounter (Signed)
Patient's daughter aware of stress test results.Pt continue having shoulder pain. Pt will see her PCP for that.

## 2011-09-20 ENCOUNTER — Telehealth: Payer: Self-pay | Admitting: Cardiovascular Disease

## 2011-09-20 NOTE — Telephone Encounter (Signed)
Please return call to pt daughter April Bennett [phone],  Pt blood pressure unstable and she is hallucinating. Plz return call to daughter to advise.

## 2011-09-20 NOTE — Telephone Encounter (Signed)
BP 118/40, states her mom has been "talking out of her head for several weeks". Pt is voiding frequently, daughter feels its due to fluid pill. This behavior is unusual for pt. Advised going to urgent care or er, daughter agreed to plan.

## 2012-12-11 ENCOUNTER — Telehealth: Payer: Self-pay | Admitting: Cardiovascular Disease

## 2012-12-11 NOTE — Telephone Encounter (Signed)
Spoke to patient's daughter. Patient last seen May 2013, although was supposed to return in November 2013 for follow up appointment and lab work but did not schedule or call about it. Daughter states that patient has Dementia. Daughter concerned with patient's level of fatigue. States that over past couple of weeks, patient is just sleeping and eating. Easily fatigued and "will even fall asleep watching tv". Advised that Jacolyn Reedy, PA, can see patient tomorrow, Dec 10th, to evaluate the increased fatigue level, however patient needs to also schedule appointment with Dr. Elease Hashimoto too for follow up and lab work. Patient has seen PCP a couple of weeks ago but daughter was not there and does not know what they discussed regarding her activity level. Scheduled appointment for patient to see Dr. Elease Hashimoto on January 6th.  Daughter will make sure patient comes tomorrow to see Jacolyn Reedy, PA, and will bring medications with her for review.

## 2012-12-11 NOTE — Telephone Encounter (Signed)
New message  Patients daughter is very concerned. Onset of 5 days ago, all her mother wants to do is sleep. She gets up in the morning and sleeps the remainder of the day. She is concerned about mini strokes. Please call daughter and advise.

## 2012-12-12 ENCOUNTER — Encounter: Payer: Self-pay | Admitting: Physician Assistant

## 2012-12-12 ENCOUNTER — Ambulatory Visit (INDEPENDENT_AMBULATORY_CARE_PROVIDER_SITE_OTHER): Payer: Medicare Other | Admitting: Physician Assistant

## 2012-12-12 VITALS — BP 120/50 | HR 51 | Ht 65.0 in | Wt 143.0 lb

## 2012-12-12 DIAGNOSIS — I498 Other specified cardiac arrhythmias: Secondary | ICD-10-CM

## 2012-12-12 DIAGNOSIS — I2510 Atherosclerotic heart disease of native coronary artery without angina pectoris: Secondary | ICD-10-CM

## 2012-12-12 DIAGNOSIS — I509 Heart failure, unspecified: Secondary | ICD-10-CM

## 2012-12-12 DIAGNOSIS — R400 Somnolence: Secondary | ICD-10-CM | POA: Insufficient documentation

## 2012-12-12 DIAGNOSIS — R404 Transient alteration of awareness: Secondary | ICD-10-CM

## 2012-12-12 NOTE — Patient Instructions (Addendum)
Your physician has recommended you make the following change in your medication:   1. Stop Metoprolol     Your physician recommends that you have a Nurse visit next week for EKG on the 17th at 10:00  Your physician recommends that you schedule a follow-up appointment with Primary Care MD

## 2012-12-12 NOTE — Assessment & Plan Note (Signed)
Patient is in a junctional bradycardia 51 beats per minute. I don't think this is causing her somnolence but we will stop her metoprolol to see if this improves her symptoms. She will come back next week for followup EKG. I discussed this patient with Dr. Johney Frame who concurs.

## 2012-12-12 NOTE — Assessment & Plan Note (Signed)
No evidence of heart failure. 

## 2012-12-12 NOTE — Assessment & Plan Note (Signed)
Stable without chest pain 

## 2012-12-12 NOTE — Progress Notes (Signed)
HPI:   This is an 79 year old female patient of Dr.Nahser has history of coronary artery disease status post CABG & Mitral valve repair 2006. She had a Lexi scan Myoview in 2013 that was normal. She also has a history of pulmonary hypertension, diabetes mellitus, hyperlipidemia, peripheral neuropathy and CHF, and dementia.   The patient's daughter brought her in today concerned that she's been sleeping all the time. She did develop a cold and was started on an antibiotic by her primary care right after Thanksgiving. Prior to Thanksgiving she was crying a lot and was started on Paxil. Since all this has happened she sleeps all the time. They have to awaken her to get her to eat and she hasn't had any appetite. She can't communicate very well because of her dementia she has not complained of any cardiac symptoms.    Allergies -- Amiodarone   -- Penicillins   -- Neosporin [Neomycin-Bacitracin Zn-Polymyx] -- Rash  Current Outpatient Prescriptions on File Prior to Visit: allopurinol (ZYLOPRIM) 100 MG tablet, Take 100 mg by mouth daily.  , Disp: , Rfl:  aspirin 81 MG tablet, Take 81 mg by mouth daily.  , Disp: , Rfl:  gabapentin (NEURONTIN) 100 MG capsule, Take 100 mg by mouth 3 (three) times daily.  , Disp: , Rfl:  insulin NPH (HUMULIN N,NOVOLIN N) 100 UNIT/ML injection, Inject 20 Units into the skin daily.  , Disp: , Rfl:  metoprolol (LOPRESSOR) 50 MG tablet, Take 25 mg by mouth daily.  , Disp: , Rfl:  Multiple Vitamin (MULTIVITAMIN) tablet, Take 1 tablet by mouth daily.  , Disp: , Rfl:  omeprazole (PRILOSEC) 20 MG capsule, Take 20 mg by mouth 2 (two) times daily.  , Disp: , Rfl:   No current facility-administered medications on file prior to visit.   Past Medical History:   Pulmonary HTN                                                Diabetes mellitus                                            Hyperlipidemia                                               Peripheral neuropathy                                         Hypertension                                                 CHF (congestive heart failure)                               Coronary artery disease  Comment:MI; S/P MI April 2006, CABG  Past Surgical History:   CORONARY ARTERY BYPASS GRAFT                                  MITRAL VALVE REPAIR                                           ABDOMINAL HYSTERECTOMY                                        BLADDER REPAIR                                                LUMBAR DISC SURGERY                                             Comment:2005   gastritis                                                     esophagitis                                                   gout                                                         No family history on file.   Social History   Marital Status: Single              Spouse Name:                      Years of Education:                 Number of children:             Occupational History   None on file  Social History Main Topics   Smoking Status: Never Smoker                     Smokeless Status: Not on file                      Alcohol Use: No             Drug Use: No             Sexual Activity:                    Other Topics  Concern   None on file  Social History Narrative   None on file    ROS: See history of present illness   PHYSICAL EXAM: Elderly,Well-nournished, in no acute distress. Neck: No JVD, HJR, Bruit, or thyroid enlargement  Lungs: No tachypnea, clear without wheezing, rales, or rhonchi  Cardiovascular: RRR, PMI not displaced, heart sounds normal, no murmurs, gallops, bruit, thrill, or heave.  Abdomen: BS normal. Soft without organomegaly, masses, lesions or tenderness.  Extremities: without cyanosis, clubbing or edema. Good distal pulses bilateral  SKin: Warm, no lesions or rashes   Musculoskeletal: No deformities  Neuro: no focal signs  BP  120/50  Pulse 51  Ht 5\' 5"  (1.651 m)  Wt 143 lb (64.864 kg)  BMI 23.80 kg/m2    EKG: EKG reviewed with Dr. Johney Frame. Looks to be in junctional rhythm at 51 beats per minute

## 2012-12-12 NOTE — Assessment & Plan Note (Signed)
Patient is sleeping all the time. I suspect that the combination of things including recent upper respiratory infection and addition of Paxil to her medication. She is in a junctional rhythm at 51 beats per minute. We will stop her metoprolol to see if this improves her symptoms. She will come back next week for followup EKG. Follow up with her primary care concerning her other medications.

## 2012-12-19 ENCOUNTER — Ambulatory Visit (INDEPENDENT_AMBULATORY_CARE_PROVIDER_SITE_OTHER): Payer: Medicare Other | Admitting: *Deleted

## 2012-12-19 ENCOUNTER — Encounter: Payer: Self-pay | Admitting: *Deleted

## 2012-12-19 VITALS — BP 134/58 | HR 71 | Wt 138.1 lb

## 2012-12-19 DIAGNOSIS — I498 Other specified cardiac arrhythmias: Secondary | ICD-10-CM

## 2012-12-19 DIAGNOSIS — I2510 Atherosclerotic heart disease of native coronary artery without angina pectoris: Secondary | ICD-10-CM

## 2012-12-19 NOTE — Progress Notes (Signed)
Pt comes in for EKG as ordered last ov with Jacolyn Reedy on 12/12/12  Dr. Graciela Husbands (DOD) reviewed & stated due to profound 1st degree heart block recommended pt be seen soon by Dr. Elease Hashimoto Appt made for pt on 12/25/12 with Dr. Neysa Hotter RN

## 2012-12-20 ENCOUNTER — Encounter: Payer: Self-pay | Admitting: Cardiovascular Disease

## 2012-12-20 ENCOUNTER — Encounter: Payer: Self-pay | Admitting: Cardiology

## 2012-12-25 ENCOUNTER — Ambulatory Visit (INDEPENDENT_AMBULATORY_CARE_PROVIDER_SITE_OTHER): Payer: Medicare Other | Admitting: Cardiovascular Disease

## 2012-12-25 ENCOUNTER — Encounter: Payer: Self-pay | Admitting: Cardiovascular Disease

## 2012-12-25 VITALS — BP 116/58 | HR 74 | Ht 65.0 in

## 2012-12-25 DIAGNOSIS — I509 Heart failure, unspecified: Secondary | ICD-10-CM

## 2012-12-25 DIAGNOSIS — I2510 Atherosclerotic heart disease of native coronary artery without angina pectoris: Secondary | ICD-10-CM

## 2012-12-25 NOTE — Patient Instructions (Signed)
Your physician wants you to follow-up in: 6 months  You will receive a reminder letter in the mail two months in advance. If you don't receive a letter, please call our office to schedule the follow-up appointment.  Your physician recommends that you continue on your current medications as directed. Please refer to the Current Medication list given to you today.  

## 2012-12-25 NOTE — Progress Notes (Signed)
April Bennett Date of Birth  [birth year]       [facility]    [place] [place] [address]  [address] [place], [place]  [address]   [place], [place]  [address] [phone]     [phone]   Fax  [phone]    Fax [phone]  Problem List: 1. Coronary artery disease-status post CABG 2. History of mitral valve 3. Pulmonary hypertension 4. Diabetes mellitus 5. Hyperlipidemia 6. Peripheral neuropathy  History of Present Illness:  April Bennett is doing quite well since I last saw her 6 months ago. She's not had any episodes of chest pain or shortness breath. She has been having some balance issues. Her family has found her on the floor when she leans over to do some housework. For example she lost her balance when reaching into the cabinet for a can of beans. She also lost her balance 1 time when hanging up clothes on the rack in the closet.  She typicall walks with the assistance of a walker. These falling episodes have occurred when she left her walker behind.  Dec. 23, 2014:  April Bennett is seen today for followup visit. She's had a chronic cough.  Doesn't sleep well.  She has seen her medical doctor.  She also does wonder whether we can get her a new elevated toilet seat.   She has some dementia .  Complains of not being able to walk.   When she saw Wynell Balloon, her HR was 72.  Metoprolol was stopped and her hr has normallized.     Current Outpatient Prescriptions on File Prior to Visit  Medication Sig Dispense Refill  . allopurinol (ZYLOPRIM) 100 MG tablet Take 100 mg by mouth daily.       Marland Kitchen aspirin 81 MG tablet Take 81 mg by mouth daily.        . furosemide (LASIX) 40 MG tablet Take 40 mg by mouth daily.       Marland Kitchen gabapentin (NEURONTIN) 100 MG capsule Take 100 mg by mouth 3 (three) times daily.        . insulin NPH (HUMULIN N,NOVOLIN N) 100 UNIT/ML injection Inject 15 Units into the skin daily.       . magnesium oxide  (MAG-OX) 400 MG tablet Take 400 mg by mouth daily.      . Multiple Vitamin (MULTIVITAMIN) tablet Take 1 tablet by mouth daily.        Marland Kitchen NAMENDA 5 MG tablet Take 5 mg by mouth 2 (two) times daily.       Marland Kitchen omeprazole (PRILOSEC) 20 MG capsule Take 20 mg by mouth 2 (two) times daily.        Marland Kitchen PARoxetine (PAXIL) 10 MG tablet Take 20 mg by mouth daily.       . potassium chloride (K-DUR) 10 MEQ tablet Take 10 mEq by mouth 2 (two) times daily.       . simvastatin (ZOCOR) 40 MG tablet Take 40 mg by mouth daily.        No current facility-administered medications on file prior to visit.    Allergies  Allergen Reactions  . Amiodarone   . Penicillins   . Neosporin [Neomycin-Bacitracin Zn-Polymyx] Rash    Past Medical History  Diagnosis Date  . Pulmonary HTN   . Diabetes mellitus   . Hyperlipidemia   . Peripheral neuropathy   . Hypertension   . CHF (congestive heart failure)   .  Coronary artery disease     MI; S/P MI April 2006, CABG    Past Surgical History  Procedure Laterality Date  . Coronary artery bypass graft    . Mitral valve repair    . Abdominal hysterectomy    . Bladder repair    . Lumbar disc surgery      2005  . Gastritis    . Esophagitis    . Gout      History  Smoking status  . Never Smoker   Smokeless tobacco  . Not on file    History  Alcohol Use No    No family history on file.  Reviw of Systems:  Reviewed in the HPI.  All other systems are negative.  Physical Exam: Blood pressure 116/58, pulse 74, height 5\' 5"  (1.651 m). General: Well developed, well nourished, in no acute distress.  She was examined in the wheelchair.  Head: Normocephalic, atraumatic, sclera non-icteric, mucus membranes are moist,   Neck: Supple. Carotids are 2 + without bruits. No JVD  Lungs: Clear bilaterally to auscultation.  Heart: regular rate.  normal  S1 S2. No murmurs, gallops or rubs.  Abdomen: Soft, non-tender, non-distended with normal bowel sounds. No  hepatomegaly. No rebound/guarding. No masses.  Msk:  Strength and tone are normal.  She has lots of bruising on his arms.  Extremities: No clubbing or cyanosis. No edema.  Distal pedal pulses are 2+ and equal bilaterally.  Neuro: Alert and oriented X 3. Moves all extremities spontaneously.  Psych:  Responds to questions appropriately with a normal affect.  ECG: Dec. 23, 2014:  NSR with 1st degree AVB.  Occasional PVCs  Assessment / Plan:

## 2012-12-25 NOTE — Assessment & Plan Note (Signed)
Ms. April Bennett seems to be doing well. She has dementia and it is difficult to get an accurate history from her. We'll continue with her same medications. Her heart rate is better.  She has first degree AV block but no evidence of advanced heart block. I'll see her again in 6 months.

## 2013-01-08 ENCOUNTER — Ambulatory Visit: Payer: Medicare Other | Admitting: Cardiovascular Disease

## 2013-06-03 ENCOUNTER — Telehealth: Payer: Self-pay | Admitting: Nurse Practitioner

## 2013-06-03 NOTE — Telephone Encounter (Signed)
Spoke with patient's daughter-in-law who states patient is in a nursing facility due to severe dementia and they were unable to care for her at home.  Family states patient is not doing well - states she recently had pneumonia and CHF; states she was treated at [facility] in [place] recently.  Family states there is no way for patient to come in for office visits.  I advised family that I called to see how the patient is doing as Dr. Elease Hashimoto has not seen her in a while and to call our office with questions or concerns.  Patient's daughter-in-law thanked me for the call.

## 2014-01-03 DEATH — deceased
